# Patient Record
Sex: Male | Born: 1970 | Hispanic: Yes | Marital: Married | Smoking: Never smoker
Health system: Southern US, Community
[De-identification: ages and names within clinical notes are randomized; demographics above are authoritative.]

## PROBLEM LIST (undated history)

## (undated) DIAGNOSIS — I2510 Atherosclerotic heart disease of native coronary artery without angina pectoris: Secondary | ICD-10-CM

## (undated) DIAGNOSIS — E7800 Pure hypercholesterolemia, unspecified: Secondary | ICD-10-CM

## (undated) DIAGNOSIS — M797 Fibromyalgia: Secondary | ICD-10-CM

## (undated) DIAGNOSIS — E119 Type 2 diabetes mellitus without complications: Secondary | ICD-10-CM

## (undated) DIAGNOSIS — I209 Angina pectoris, unspecified: Secondary | ICD-10-CM

## (undated) DIAGNOSIS — M1990 Unspecified osteoarthritis, unspecified site: Secondary | ICD-10-CM

## (undated) DIAGNOSIS — M109 Gout, unspecified: Secondary | ICD-10-CM

## (undated) DIAGNOSIS — R0602 Shortness of breath: Secondary | ICD-10-CM

## (undated) HISTORY — DX: Fibromyalgia: M79.7

---
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
## 1998-11-10 HISTORY — PX: GASTRIC BYPASS: SHX52

## 2012-04-19 ENCOUNTER — Encounter (HOSPITAL_BASED_OUTPATIENT_CLINIC_OR_DEPARTMENT_OTHER): Payer: Self-pay | Admitting: *Deleted

## 2012-04-19 ENCOUNTER — Emergency Department (HOSPITAL_BASED_OUTPATIENT_CLINIC_OR_DEPARTMENT_OTHER)
Admission: EM | Admit: 2012-04-19 | Discharge: 2012-04-19 | Disposition: A | Discharge status: Home / Self Care | Payer: Managed Care, Other (non HMO) | Attending: Emergency Medicine | Admitting: Emergency Medicine

## 2012-04-19 DIAGNOSIS — L02419 Cutaneous abscess of limb, unspecified: Secondary | ICD-10-CM | POA: Insufficient documentation

## 2012-04-19 DIAGNOSIS — L03115 Cellulitis of right lower limb: Secondary | ICD-10-CM

## 2012-04-19 DIAGNOSIS — E119 Type 2 diabetes mellitus without complications: Secondary | ICD-10-CM | POA: Insufficient documentation

## 2012-04-19 DIAGNOSIS — Z794 Long term (current) use of insulin: Secondary | ICD-10-CM | POA: Insufficient documentation

## 2012-04-19 DIAGNOSIS — R739 Hyperglycemia, unspecified: Secondary | ICD-10-CM

## 2012-04-19 LAB — GLUCOSE, CAPILLARY: Glucose-Capillary: 308 mg/dL — ABNORMAL HIGH (ref 70–99)

## 2012-04-19 MED ORDER — SULFAMETHOXAZOLE-TMP DS 800-160 MG PO TABS: 1.0000 | ORAL_TABLET | Freq: Once | ORAL | Status: AC

## 2012-04-19 MED ORDER — HYDROCODONE-ACETAMINOPHEN 5-325 MG PO TABS
2.0000 | ORAL_TABLET | Freq: Once | ORAL | Status: AC
  Administered 2012-04-19: 2 via ORAL
  Filled 2012-04-19: qty 2

## 2012-04-19 MED ORDER — INSULIN REGULAR HUMAN 100 UNIT/ML IJ SOLN
8.0000 [IU] | Freq: Once | INTRAMUSCULAR | Status: AC
  Administered 2012-04-19: 8 [IU] via SUBCUTANEOUS

## 2012-04-19 MED ORDER — INSULIN REGULAR HUMAN 100 UNIT/ML IJ SOLN
INTRAMUSCULAR | Status: AC
  Filled 2012-04-19: qty 1

## 2012-04-19 MED ORDER — HYDROCODONE-ACETAMINOPHEN 5-325 MG PO TABS: ORAL_TABLET | ORAL | Status: AC

## 2012-04-19 MED ORDER — CEPHALEXIN 500 MG PO CAPS: 500.0000 mg | ORAL_CAPSULE | Freq: Two times a day (BID) | ORAL | Status: AC

## 2012-04-19 MED ORDER — SULFAMETHOXAZOLE-TMP DS 800-160 MG PO TABS
1.0000 | ORAL_TABLET | Freq: Once | ORAL | Status: AC
  Administered 2012-04-19: 1 via ORAL
  Filled 2012-04-19: qty 1

## 2012-04-19 MED ORDER — METFORMIN HCL 500 MG PO TABS: 500.0000 mg | ORAL_TABLET | Freq: Two times a day (BID) | ORAL | Status: DC

## 2012-04-19 NOTE — ED Notes (Signed)
Redness, swelling, pain to his right lower leg. Hx of diabetes. His legs got weak and he fell cutting his leg on a wooden stairway 5 days ago.

## 2012-04-19 NOTE — ED Provider Notes (Signed)
History   This chart was scribed for Gavin Pound. Oletta Lamas, MD by Melba Coon. The patient was seen in room MH12/MH12 and the patient's care was started at 9:42PM.    CSN: [id number]  Arrival date & time 04/19/12  1956   First MD Initiated Contact with Patient 04/19/12 2131      Chief Complaint  Patient presents with  . Fall    (Consider location/radiation/quality/duration/timing/severity/associated sxs/prior treatment) HPI Cameron Jensen is a 41 y.o. male who presents to the Emergency Department complaining of constant, throbbing, moderate to severe right shin pain with associated redness an onset 5 days ago pertaining to a fall, no head contact, no LOC. Pt has Hx of diabetes mellitus and sometimes gets leg weakness and numbness; pt states that his legs gave out then he fell. Pt just moved here to [facility] from [place] and has not found a PCP to refill his medications; Pt ran out of medication 3 months ago; pt took metformin, Lantis and Novolog. Pt states that he feels a pull in groin and feet; pt wears diabetic compression socks and when he took them off, the pain started to throb. Pt is able to ambulate, but ambulation aggravates the pain. No HA, fever, neck pain, sore throat, rash, back pain, CP, SOB, abd pain, n/v/d, dysuria, or extremity tingling. No known allergies. No other pertinent medical symptoms.  Past Medical History  Diagnosis Date  . Diabetes mellitus     Past Surgical History  Procedure Date  . Knee surgery   . Gastric bypass   . Arthoscopic rotaor cuff repair     No family history on file.  History  Substance Use Topics  . Smoking status: Never Smoker   . Smokeless tobacco: Not on file  . Alcohol Use: No      Review of Systems  Constitutional: Negative for fever and chills.  Musculoskeletal: Positive for arthralgias.  Skin: Positive for wound.   10 Systems reviewed and all are negative for acute change except as noted in the HPI.   Allergies  Review of  patient's allergies indicates no known allergies.  Home Medications   Current Outpatient Rx  Name Route Sig Dispense Refill  . CEPHALEXIN 500 MG PO CAPS Oral Take 1 capsule (500 mg total) by mouth 2 (two) times daily. 20 capsule 0  . HYDROCODONE-ACETAMINOPHEN 5-325 MG PO TABS  1-2 tablets po q 6 hours prn moderate to severe pain 20 tablet 0  . METFORMIN HCL 500 MG PO TABS Oral Take 1 tablet (500 mg total) by mouth 2 (two) times daily with a meal. 60 tablet 0  . SULFAMETHOXAZOLE-TMP DS 800-160 MG PO TABS Oral Take 1 tablet by mouth once. 20 tablet 0    BP 163/91  Pulse 89  Temp(Src) 98.1 F (36.7 C) (Oral)  Resp 20  SpO2 98%  Physical Exam  Nursing note and vitals reviewed. Constitutional: He is oriented to person, place, and time. He appears well-developed and well-nourished. No distress.       Awake, alert, nontoxic appearance.  HENT:  Head: Normocephalic and atraumatic.  Right Ear: External ear normal.  Left Ear: External ear normal.  Eyes: EOM are normal. Pupils are equal, round, and reactive to light. Right eye exhibits no discharge. Left eye exhibits no discharge.  Neck: Normal range of motion.  Cardiovascular: Normal rate, regular rhythm and normal heart sounds.   No murmur heard. Pulmonary/Chest: Effort normal and breath sounds normal. He exhibits no tenderness.  Abdominal: Soft. Bowel sounds  are normal. There is no tenderness. There is no rebound.  Musculoskeletal: Normal range of motion. He exhibits edema (Right shin) and tenderness (minimal tenderness to right shin).       Baseline ROM, no obvious new focal weakness.  Neurological: He is alert and oriented to person, place, and time.       Mental status and motor strength appears baseline for patient and situation.  Skin: Skin is warm and dry. He is not diaphoretic. There is erythema (erythematous patch to right shin with 2 open sores with dry exudate, no expression of purulent drainage).  Psychiatric: He has a normal  mood and affect. His behavior is normal.    ED Course  Procedures (including critical care time)  DIAGNOSTIC STUDIES: Oxygen Saturation is 98% on room air, normal by my interpretation.    COORDINATION OF CARE:  9:49PM - EDMD will take blood glucose and order metformin; pt will be referred to PCP   Labs Reviewed  GLUCOSE, CAPILLARY - Abnormal; Notable for the following:    Glucose-Capillary 308 (*)    All other components within normal limits   No results found.   1. Cellulitis of right anterior lower leg   2. Hyperglycemia       MDM  I personally performed the services described in this documentation, which was scribed in my presence. The recorded information has been reviewed and considered.    Pt is not toxic in appearance, no fever.  Pt needs PCP as well.  Referred to [place] [facility] at [facility] for both follow up and establish care. Glucose is only in 300's.  I gave refill for metformin to restart treatment, also oral abx, double coverage for focal cellulitis of lower leg.        Gavin Pound. Oletta Lamas, MD 04/24/12 [phone]

## 2012-04-19 NOTE — Discharge Instructions (Signed)
Cellulitis Cellulitis is an infection of the skin and the tissue beneath it. The area is typically red and tender. It is caused by germs (bacteria) (usually staph or strep) that enter the body through cuts or sores. Cellulitis most commonly occurs in the arms or lower legs.  HOME CARE INSTRUCTIONS   If you are given a prescription for medications which kill germs (antibiotics), take as directed until finished.   If the infection is on the arm or leg, keep the limb elevated as able.   Use a warm cloth several times per day to relieve pain and encourage healing.   See your caregiver for recheck of the infected site as directed if problems arise.   Only take over-the-counter or prescription medicines for pain, discomfort, or fever as directed by your caregiver.  SEEK MEDICAL CARE IF:   The area of redness (inflammation) is spreading, there are red streaks coming from the infected site, or if a part of the infection begins to turn dark in color.   The joint or bone underneath the infected skin becomes painful after the skin has healed.   The infection returns in the same or another area after it seems to have gone away.   A boil or bump swells up. This may be an abscess.   New, unexplained problems such as pain or fever develop.  SEEK IMMEDIATE MEDICAL CARE IF:   You have a fever.   You or your child feels drowsy or lethargic.   There is vomiting, diarrhea, or lasting discomfort or feeling ill (malaise) with muscle aches and pains.  MAKE SURE YOU:   Understand these instructions.   Will watch your condition.   Will get help right away if you are not doing well or get worse.  Document Released: 08/06/2005 Document Revised: 10/16/2011 Document Reviewed: 06/14/2008 [facility] Patient Information 2012 [place], [place].Hyperglycemia Hyperglycemia occurs when the glucose (sugar) in your blood is too high. Hyperglycemia can happen for many reasons, but it most often happens to people who do  not know they have diabetes or are not managing their diabetes properly.  CAUSES  Whether you have diabetes or not, there are other causes of hyperglycemia. Hyperglycemia can occur when you have diabetes, but it can also occur in other situations that you might not be as aware of, such as: Diabetes  If you have diabetes and are having problems controlling your blood glucose, hyperglycemia could occur because of some of the following reasons:   Not following your meal plan.   Not taking your diabetes medications or not taking it properly.   Exercising less or doing less activity than you normally do.   Being sick.  Pre-diabetes  This cannot be ignored. Before people develop Type 2 diabetes, they almost always have "pre-diabetes." This is when your blood glucose levels are higher than normal, but not yet high enough to be diagnosed as diabetes. Research has shown that some long-term damage to the body, especially the heart and circulatory system, may already be occurring during pre-diabetes. If you take action to manage your blood glucose when you have pre-diabetes, you may delay or prevent Type 2 diabetes from developing.  Stress  If you have diabetes, you may be "diet" controlled or on oral medications or insulin to control your diabetes. However, you may find that your blood glucose is higher than usual in the hospital whether you have diabetes or not. This is often referred to as "stress hyperglycemia." Stress can elevate your blood glucose.  This happens because of hormones put out by the body during times of stress. If stress has been the cause of your high blood glucose, it can be followed regularly by your caregiver. That way he/she can make sure your hyperglycemia does not continue to get worse or progress to diabetes.  Steroids  Steroids are medications that act on the infection fighting system (immune system) to block inflammation or infection. One side effect can be a rise in blood  glucose. Most people can produce enough extra insulin to allow for this rise, but for those who cannot, steroids make blood glucose levels go even higher. It is not unusual for steroid treatments to "uncover" diabetes that is developing. It is not always possible to determine if the hyperglycemia will go away after the steroids are stopped. A special blood test called an A1c is sometimes done to determine if your blood glucose was elevated before the steroids were started.  SYMPTOMS  Thirsty.   Frequent urination.   Dry mouth.   Blurred vision.   Tired or fatigue.   Weakness.   Sleepy.   Tingling in feet or leg.  DIAGNOSIS  Diagnosis is made by monitoring blood glucose in one or all of the following ways:  A1c test. This is a chemical found in your blood.   Fingerstick blood glucose monitoring.   Laboratory results.  TREATMENT  First, knowing the cause of the hyperglycemia is important before the hyperglycemia can be treated. Treatment may include, but is not be limited to:  Education.   Change or adjustment in medications.   Change or adjustment in meal plan.   Treatment for an illness, infection, etc.   More frequent blood glucose monitoring.   Change in exercise plan.   Decreasing or stopping steroids.   Lifestyle changes.  HOME CARE INSTRUCTIONS   Test your blood glucose as directed.   Exercise regularly. Your caregiver will give you instructions about exercise. Pre-diabetes or diabetes which comes on with stress is helped by exercising.   Eat wholesome, balanced meals. Eat often and at regular, fixed times. Your caregiver or nutritionist will give you a meal plan to guide your sugar intake.   Being at an ideal weight is important. If needed, losing as little as 10 to 15 pounds may help improve blood glucose levels.  SEEK MEDICAL CARE IF:   You have questions about medicine, activity, or diet.   You continue to have symptoms (problems such as increased  thirst, urination, or weight gain).  SEEK IMMEDIATE MEDICAL CARE IF:   You are vomiting or have diarrhea.   Your breath smells fruity.   You are breathing faster or slower.   You are very sleepy or incoherent.   You have numbness, tingling, or pain in your feet or hands.   You have chest pain.   Your symptoms get worse even though you have been following your caregiver's orders.   If you have any other questions or concerns.  Document Released: 04/22/2001 Document Revised: 10/16/2011 Document Reviewed: 06/18/2009 [facility] Patient Information 2012 [place], [place].

## 2012-04-24 ENCOUNTER — Encounter (HOSPITAL_BASED_OUTPATIENT_CLINIC_OR_DEPARTMENT_OTHER): Payer: Self-pay | Admitting: Emergency Medicine

## 2012-05-10 ENCOUNTER — Ambulatory Visit: Payer: Managed Care, Other (non HMO) | Admitting: Family

## 2012-05-10 DIAGNOSIS — Z0289 Encounter for other administrative examinations: Secondary | ICD-10-CM

## 2013-02-22 ENCOUNTER — Ambulatory Visit
Admission: RE | Admit: 2013-02-22 | Discharge: 2013-02-22 | Disposition: A | Discharge status: Home / Self Care | Payer: Managed Care, Other (non HMO) | Source: Ambulatory Visit | Attending: Sports Medicine | Admitting: Sports Medicine

## 2013-02-22 ENCOUNTER — Other Ambulatory Visit: Payer: Self-pay | Admitting: Sports Medicine

## 2013-02-22 DIAGNOSIS — R52 Pain, unspecified: Secondary | ICD-10-CM

## 2013-04-10 HISTORY — PX: CARDIAC CATHETERIZATION: SHX172

## 2013-04-25 ENCOUNTER — Observation Stay (HOSPITAL_COMMUNITY)
Admission: EM | Admit: 2013-04-25 | Discharge: 2013-04-26 | Disposition: A | Discharge status: Home / Self Care | Payer: Managed Care, Other (non HMO) | Attending: Cardiology | Admitting: Cardiology

## 2013-04-25 ENCOUNTER — Other Ambulatory Visit: Payer: Self-pay

## 2013-04-25 ENCOUNTER — Encounter (HOSPITAL_COMMUNITY): Admission: EM | Disposition: A | Discharge status: Home / Self Care | Payer: Self-pay | Source: Home / Self Care

## 2013-04-25 ENCOUNTER — Other Ambulatory Visit: Payer: Self-pay | Admitting: Cardiology

## 2013-04-25 ENCOUNTER — Ambulatory Visit (HOSPITAL_COMMUNITY): Admit: 2013-04-25 | Payer: Self-pay | Admitting: Interventional Cardiology

## 2013-04-25 DIAGNOSIS — E1149 Type 2 diabetes mellitus with other diabetic neurological complication: Secondary | ICD-10-CM | POA: Diagnosis present

## 2013-04-25 DIAGNOSIS — IMO0002 Reserved for concepts with insufficient information to code with codable children: Secondary | ICD-10-CM | POA: Diagnosis present

## 2013-04-25 DIAGNOSIS — I2510 Atherosclerotic heart disease of native coronary artery without angina pectoris: Principal | ICD-10-CM | POA: Diagnosis not present

## 2013-04-25 DIAGNOSIS — Z6841 Body Mass Index (BMI) 40.0 and over, adult: Secondary | ICD-10-CM

## 2013-04-25 DIAGNOSIS — I200 Unstable angina: Secondary | ICD-10-CM | POA: Diagnosis present

## 2013-04-25 DIAGNOSIS — E785 Hyperlipidemia, unspecified: Secondary | ICD-10-CM | POA: Insufficient documentation

## 2013-04-25 DIAGNOSIS — E1165 Type 2 diabetes mellitus with hyperglycemia: Secondary | ICD-10-CM

## 2013-04-25 DIAGNOSIS — Z79899 Other long term (current) drug therapy: Secondary | ICD-10-CM | POA: Insufficient documentation

## 2013-04-25 DIAGNOSIS — R0789 Other chest pain: Secondary | ICD-10-CM | POA: Insufficient documentation

## 2013-04-25 DIAGNOSIS — E119 Type 2 diabetes mellitus without complications: Secondary | ICD-10-CM | POA: Insufficient documentation

## 2013-04-25 DIAGNOSIS — R9439 Abnormal result of other cardiovascular function study: Secondary | ICD-10-CM | POA: Insufficient documentation

## 2013-04-25 HISTORY — PX: LEFT HEART CATHETERIZATION WITH CORONARY ANGIOGRAM: SHX5451

## 2013-04-25 LAB — CBC
HCT: 40.1 % (ref 39.0–52.0)
HCT: 37 % — ABNORMAL LOW (ref 39.0–52.0)
Hemoglobin: 12.6 g/dL — ABNORMAL LOW (ref 13.0–17.0)
MCHC: 34.7 g/dL (ref 30.0–36.0)
MCV: 88.3 fL (ref 78.0–100.0)
RDW: 13.7 % (ref 11.5–15.5)
RDW: 13.8 % (ref 11.5–15.5)
WBC: 11.3 10*3/uL — ABNORMAL HIGH (ref 4.0–10.5)

## 2013-04-25 LAB — CREATININE, SERUM: GFR calc Af Amer: >90 mL/min (ref >90)

## 2013-04-25 LAB — BASIC METABOLIC PANEL
BUN: 11 mg/dL (ref 6–23)
Chloride: 101 mEq/L (ref 96–112)
Creatinine, Ser: 0.65 mg/dL (ref 0.50–1.35)
GFR calc Af Amer: >90 mL/min (ref >90)
GFR calc non Af Amer: >90 mL/min (ref >90)

## 2013-04-25 LAB — PLATELET INHIBITION P2Y12: Platelet Function  P2Y12: 294 [PRU] (ref 194–418)

## 2013-04-25 LAB — GLUCOSE, CAPILLARY: Glucose-Capillary: 189 mg/dL — ABNORMAL HIGH (ref 70–99)

## 2013-04-25 LAB — PROTIME-INR: INR: 0.89 (ref 0.00–1.49)

## 2013-04-25 SURGERY — LEFT HEART CATHETERIZATION WITH CORONARY ANGIOGRAM
Anesthesia: LOCAL

## 2013-04-25 MED ORDER — OXYCODONE-ACETAMINOPHEN 5-325 MG PO TABS: 1.0000 | ORAL_TABLET | ORAL | Status: DC | PRN

## 2013-04-25 MED ORDER — METOPROLOL SUCCINATE ER 25 MG PO TB24
25.0000 mg | ORAL_TABLET | Freq: Every day | ORAL | Status: DC
  Administered 2013-04-25 – 2013-04-26 (×2): 25 mg via ORAL
  Filled 2013-04-25 (×2): qty 1

## 2013-04-25 MED ORDER — HEPARIN SODIUM (PORCINE) 1000 UNIT/ML IJ SOLN
INTRAMUSCULAR | Status: AC
  Filled 2013-04-25: qty 1

## 2013-04-25 MED ORDER — MIDAZOLAM HCL 2 MG/2ML IJ SOLN
INTRAMUSCULAR | Status: AC
  Filled 2013-04-25: qty 2

## 2013-04-25 MED ORDER — LIDOCAINE HCL (PF) 1 % IJ SOLN
INTRAMUSCULAR | Status: AC
  Filled 2013-04-25: qty 30

## 2013-04-25 MED ORDER — ACETAMINOPHEN 325 MG PO TABS: 650.0000 mg | ORAL_TABLET | ORAL | Status: DC | PRN

## 2013-04-25 MED ORDER — ONDANSETRON HCL 4 MG/2ML IJ SOLN: 4.0000 mg | Freq: Four times a day (QID) | INTRAMUSCULAR | Status: DC | PRN

## 2013-04-25 MED ORDER — SODIUM CHLORIDE 0.9 % IV SOLN
INTRAVENOUS | Status: DC
  Administered 2013-04-25: 600 mL via INTRAVENOUS

## 2013-04-25 MED ORDER — ENOXAPARIN SODIUM 40 MG/0.4ML ~~LOC~~ SOLN
40.0000 mg | SUBCUTANEOUS | Status: DC
  Administered 2013-04-26: 09:00:00 40 mg via SUBCUTANEOUS
  Filled 2013-04-25 (×2): qty 0.4

## 2013-04-25 MED ORDER — ASPIRIN 81 MG PO CHEW: 324.0000 mg | CHEWABLE_TABLET | Freq: Once | ORAL | Status: DC

## 2013-04-25 MED ORDER — FENTANYL CITRATE 0.05 MG/ML IJ SOLN
INTRAMUSCULAR | Status: AC
  Filled 2013-04-25: qty 2

## 2013-04-25 MED ORDER — ATORVASTATIN CALCIUM 40 MG PO TABS
40.0000 mg | ORAL_TABLET | Freq: Every day | ORAL | Status: DC
  Administered 2013-04-25: 18:00:00 40 mg via ORAL
  Filled 2013-04-25 (×2): qty 1

## 2013-04-25 MED ORDER — ASPIRIN EC 325 MG PO TBEC
325.0000 mg | DELAYED_RELEASE_TABLET | Freq: Every day | ORAL | Status: DC
  Administered 2013-04-26: 09:00:00 325 mg via ORAL
  Filled 2013-04-25: qty 1

## 2013-04-25 MED ORDER — SODIUM CHLORIDE 0.9 % IV SOLN: INTRAVENOUS | Status: DC

## 2013-04-25 MED ORDER — CLOPIDOGREL BISULFATE 75 MG PO TABS
75.0000 mg | ORAL_TABLET | Freq: Every day | ORAL | Status: DC
  Administered 2013-04-26: 75 mg via ORAL
  Filled 2013-04-25: qty 1

## 2013-04-25 MED ORDER — VERAPAMIL HCL 2.5 MG/ML IV SOLN
INTRAVENOUS | Status: AC
  Filled 2013-04-25: qty 2

## 2013-04-25 MED ORDER — NITROGLYCERIN 0.4 MG SL SUBL: 0.4000 mg | SUBLINGUAL_TABLET | SUBLINGUAL | Status: DC | PRN

## 2013-04-25 MED ORDER — ASPIRIN 81 MG PO CHEW
CHEWABLE_TABLET | ORAL | Status: AC
  Administered 2013-04-25: 324 mg
  Filled 2013-04-25: qty 4

## 2013-04-25 MED ORDER — DIAZEPAM 2 MG PO TABS: 2.0000 mg | ORAL_TABLET | ORAL | Status: DC

## 2013-04-25 MED ORDER — HEPARIN SODIUM (PORCINE) 5000 UNIT/ML IJ SOLN
INTRAMUSCULAR | Status: AC
  Administered 2013-04-25: 4000 [IU]
  Filled 2013-04-25: qty 1

## 2013-04-25 MED ORDER — HEPARIN (PORCINE) IN NACL 2-0.9 UNIT/ML-% IJ SOLN
INTRAMUSCULAR | Status: AC
  Filled 2013-04-25: qty 1000

## 2013-04-25 NOTE — ED Notes (Signed)
Pt BIB EMS for EKG changes during stress test with ST elevation. Dr.Smith met pt in ED.

## 2013-04-25 NOTE — CV Procedure (Signed)
     Diagnostic Cardiac Catheterization Report  Cameron Jensen  42 y.o.  male 1970/12/09  Procedure Date: 04/25/2013 Referring Physician: Armanda Magic, MD Primary Cardiologist:: Armanda Magic, MD   PROCEDURE:  Left heart catheterization with selective coronary angiography, left ventriculogram.  INDICATIONS:  Unstable angina pectoris  The risks, benefits, and details of the procedure were explained to the patient.  The patient verbalized understanding and wanted to proceed.  Informed written consent was obtained.  PROCEDURE TECHNIQUE:  After Xylocaine anesthesia a 5 French Slender sheath was placed in the right femoral artery with a single anterior needle wall stick.   Coronary angiography was done using a 5 [place] A2 MP, 5 French 3.5 cm left Judkins, and 5 [place] 3 cm EBU catheter.  Left ventriculography was done using the 5 French A2 MP catheter.   The mid to distal LAD, a very small vessel in the 2 mm diameter range contained a 90-95% focal stenosis within a region of diffuse luminal irregularity. The distal territory is small. Cranial view suggests an eccentric proximal left main. I chose against PCI on the distal LAD because of the small caliber of the vessel and high likelihood stenting would not be possible. Restenosis with balloon angioplasty would be very high.  CONTRAST:  Total of 110 cc.  COMPLICATIONS:  None.    HEMODYNAMICS:  Aortic pressure was 118/78 mmHg; LV pressure was 118/4 mmHg; LVEDP 12 mmHg.  There was no gradient between the left ventricle and aorta.    ANGIOGRAPHIC DATA:   The left main coronary artery is contains eccentric 50% narrowing in AP cranial and LAO cranial views. Other views revealed the left main to be widely patent. BrISK contrast refluxes into the aorta. No damping is noted.  The left anterior descending artery is large given origin to a mid vessel trifurcation resulting in a large diagonal a smaller more medial diagonal and continuation of the  LAD and in the very distal third of the LAD there is focal 90% obstruction in a segment that appears to be 2 mm in diameter. The distal territory beyond the stenosis is small..  The left circumflex artery is normal.  The right coronary artery is dominant with luminal irregularities but no significant obstruction.  LEFT VENTRICULOGRAM:  Left ventricular angiogram was done in the 30 RAO projection and revealed normal left ventricular wall motion and systolic function with an estimated ejection fraction of 60%.    IMPRESSIONS:  1. Early positive exercise treadmill test likely related to distal LAD obstruction as noted above  2. Cranial views of the left main suggests eccentric narrowing, other views reveal wide patency.  3. Essentially normal circumflex and right coronaries  4. Normal LV function   RECOMMENDATION:  Initiate medical therapy and if refractory symptoms consider balloon angioplasty of the distal LAD. This was not chosen as the primary treatment option due to distal location, small vessel caliber, and diabetes which increases the risk of restenosis.

## 2013-04-25 NOTE — ED Notes (Signed)
Pt son placed in CCU waiting room. Cath Lab and Dr.Smith aware

## 2013-04-25 NOTE — Progress Notes (Signed)
CSW was contacted concerning [name] that was with him at time of admission.   Cath Nurse would like CSW to come an assist with finding care for the [name] while Pt is receiving a cath.   [name] waited in lobby of 2nd floor outside cath lab without any disruptions.   CSW contacted unit [id number] to inquire if [name] would be permitted to spend the night. Unit nurse stated [name] could spend the night and would get son a meal voucher for tonight and in the morning.   CSW spoke with the Pt to make Pt aware of arrangements made for tonight, however stressed to the Pt that additional arrangements would need to be made if he were going to need a subsequent night stay. Pt voiced understanding and will attempt to contact his wife for other arrangements. Pt's wife is currently out of town and will be arriving back tomorrow.   CSW informed oncoming CSW about case and CSW will assist if needed. [phone])   Cameron Jensen, LCSWA [facility] Emergency Dept.  [phone]

## 2013-04-25 NOTE — H&P (Signed)
The patient is 22 and for greater than a year has had intermittent episodes of fullness in his left precordial region. The discomfort is exertional at times but can also occur at rest. Approximately 7-10 days ago he had a severe episode of discomfort that was associated with diaphoresis HAL and near fainting. He saw Dr. Mayford Knife 16/5/14 and was scheduled for stress test today which was significant for the development of ST elevation in 2,3, aVF, and lateral precordial leads (less than 1 mm) post exercise. He also had chest discomfort post exercise greater than 3/10. The discomfort is now down to a 1/10 discomfort. He denies dyspnea. He has no history of heart disease, tobacco use, or hypertension. He is a diabetic for greater than 10 years.  The high likelihood of critical coronary disease was discussed with the patient. Coronary angiography and possible PCI were explained in detail. The purpose of stenting was discussed. The nature of catheterization and PCI was discussed including associated risks. Risks of stroke, death, myocardial infarction, allergy, bleeding, kidney injury, limb ischemia, emergency surgery, allergy, among others were discussed in detail and accepted by the patient. The patient consented to proceeding with catheterization and possible PCI.

## 2013-04-25 NOTE — Progress Notes (Signed)
Utilization Review Completed Tashala Cumbo J. Rowland Ericsson, RN, BSN, NCM [phone]  

## 2013-04-25 NOTE — Progress Notes (Signed)
Responded to page from ED Charge nurse to assist staff with Cath Lab pt. and his 12 yr son. Pt. going for cath and son was going to be placed in consult room. Chaplain services was requested to assist with son until family or someone else could be notified. Father spoke with Cath doctor and staff regarding a nieghbor who could be contacted to assist with child's care.  I was informed that  there were no other family who was available. ED nurse and cath lab coordinator insured that child was feed and safe.  CSW was called . Cath staff will call Chaplain if further  services are needed.   Passed on information to all other Chaplain for support as needed.

## 2013-04-26 ENCOUNTER — Encounter (HOSPITAL_COMMUNITY): Payer: Self-pay | Admitting: *Deleted

## 2013-04-26 DIAGNOSIS — I2510 Atherosclerotic heart disease of native coronary artery without angina pectoris: Secondary | ICD-10-CM | POA: Diagnosis not present

## 2013-04-26 LAB — GLUCOSE, CAPILLARY
Glucose-Capillary: 93 mg/dL (ref 70–99)
Glucose-Capillary: 166 mg/dL — ABNORMAL HIGH (ref 70–99)

## 2013-04-26 MED ORDER — NITROGLYCERIN 0.4 MG SL SUBL: 0.4000 mg | SUBLINGUAL_TABLET | SUBLINGUAL | Status: DC | PRN

## 2013-04-26 MED ORDER — ASPIRIN EC 81 MG PO TBEC: 81.0000 mg | DELAYED_RELEASE_TABLET | Freq: Every day | ORAL | Status: DC

## 2013-04-26 MED ORDER — CLOPIDOGREL BISULFATE 75 MG PO TABS: 75.0000 mg | ORAL_TABLET | Freq: Every day | ORAL | Status: DC

## 2013-04-26 MED ORDER — PNEUMOCOCCAL VAC POLYVALENT 25 MCG/0.5ML IJ INJ
0.5000 mL | INJECTION | Freq: Once | INTRAMUSCULAR | Status: AC
  Administered 2013-04-26: 13:00:00 0.5 mL via INTRAMUSCULAR
  Filled 2013-04-26: qty 0.5

## 2013-04-26 MED ORDER — METOPROLOL SUCCINATE ER 25 MG PO TB24: 25.0000 mg | ORAL_TABLET | Freq: Every day | ORAL | Status: DC

## 2013-04-26 MED ORDER — METFORMIN HCL ER (MOD) 500 MG PO TB24: 1000.0000 mg | ORAL_TABLET | Freq: Every day | ORAL | Status: DC

## 2013-04-26 MED ORDER — PNEUMOCOCCAL VAC POLYVALENT 25 MCG/0.5ML IJ INJ: 0.5000 mL | INJECTION | INTRAMUSCULAR | Status: DC

## 2013-04-26 NOTE — Progress Notes (Signed)
CARDIAC REHAB PHASE I   PRE:  Rate/Rhythm: 80 SR    BP: sitting 130/90    SaO2:   MODE:  Ambulation: 1000 ft   POST:  Rate/Rhythm: 94 SR    BP: sitting 135/91     SaO2:   Received consult order to ambulate and educate. Tolerated long walk, including slight incline, well without CP however pt does st he had chest pressure last night. Relieved with time, did not take any medicine. BP elevated before and after walk. Pt does st he feels slightly lightheaded walking. Ed completed including ex gl to slowly increase his walking from 7 min at a time to >30 min a day. Pt to clear this with cardiologist. Also pt is interested in [facility] and requests his name be sent to [facility]. [phone]  Elissa Lovett [place] CES, ACSM 04/26/2013 10:50 AM

## 2013-04-26 NOTE — Progress Notes (Signed)
Pt got dressed and says when he tried to put his watch on his right wrist suddenly swelled.  Pt called RN immediately.  Rt wrist grossly swollen with hematoma approx 3x2x3 cm.  Hematoma compressed easily with manual pressure x 20 min.  Drsg applied.  Pt tolerated well.  Dr Mayford Knife notified, advised to wait 2 hrs for discharge to observe.

## 2013-04-26 NOTE — Progress Notes (Signed)
Received order however order sts "If PCI" which pt did not receive a PCI. Please order Cardiac Rehab Phase I if our services are warranted. Thank you. Ethelda Chick CES, ACSM 7:38 AM 04/26/2013

## 2013-04-26 NOTE — Discharge Summary (Signed)
Patient ID: Cameron Jensen MRN: [id number] DOB/AGE: 02-14-1971 42 y.o.  Admit date: 04/25/2013 Discharge date: 04/26/2013  Primary Discharge Diagnosis  Coronary artery disease Secondary Discharge Diagnosis  DM  Morbid obesity  Significant Diagnostic Studies: angiography:   Diagnostic Cardiac Catheterization Report  Cameron Jensen  42 y.o.  male  [birth year]  Procedure Date: 04/25/2013  Referring Physician: Armanda Magic, MD  Primary Cardiologist:: Armanda Magic, MD  PROCEDURE: Left heart catheterization with selective coronary angiography, left ventriculogram.  INDICATIONS: Unstable angina pectoris  The risks, benefits, and details of the procedure were explained to the patient. The patient verbalized understanding and wanted to proceed. Informed written consent was obtained.  PROCEDURE TECHNIQUE: After Xylocaine anesthesia a 5 French Slender sheath was placed in the right femoral artery with a single anterior needle wall stick. Coronary angiography was done using a 5 [place] A2 MP, 5 French 3.5 cm left Judkins, and 5 [place] 3 cm EBU catheter. Left ventriculography was done using the 5 French A2 MP catheter.  The mid to distal LAD, a very small vessel in the 2 mm diameter range contained a 90-95% focal stenosis within a region of diffuse luminal irregularity. The distal territory is small. Cranial view suggests an eccentric proximal left main. I chose against PCI on the distal LAD because of the small caliber of the vessel and high likelihood stenting would not be possible. Restenosis with balloon angioplasty would be very high.  CONTRAST: Total of 110 cc.  COMPLICATIONS: None.  HEMODYNAMICS: Aortic pressure was 118/78 mmHg; LV pressure was 118/4 mmHg; LVEDP 12 mmHg. There was no gradient between the left ventricle and aorta.  ANGIOGRAPHIC DATA: The left main coronary artery is contains eccentric 50% narrowing in AP cranial and LAO cranial views. Other views revealed the left main to be  widely patent. BrISK contrast refluxes into the aorta. No damping is noted.  The left anterior descending artery is large given origin to a mid vessel trifurcation resulting in a large diagonal a smaller more medial diagonal and continuation of the LAD and in the very distal third of the LAD there is focal 90% obstruction in a segment that appears to be 2 mm in diameter. The distal territory beyond the stenosis is small..  The left circumflex artery is normal.  The right coronary artery is dominant with luminal irregularities but no significant obstruction.  LEFT VENTRICULOGRAM: Left ventricular angiogram was done in the 30 RAO projection and revealed normal left ventricular wall motion and systolic function with an estimated ejection fraction of 60%.  IMPRESSIONS: 1. Early positive exercise treadmill test likely related to distal LAD obstruction as noted above  2. Cranial views of the left main suggests eccentric narrowing, other views reveal wide patency.  3. Essentially normal circumflex and right coronaries  4. Normal LV function  RECOMMENDATION: Initiate medical therapy and if refractory symptoms consider balloon angioplasty of the distal LAD. This was not chosen as the primary treatment option due to distal location, small vessel caliber, and diabetes which increases the risk of restenosis.        Consults: None  Hospital Course: This is a 42yo Hispanic male with a history of DM, obesity, and dyslipidemia who presented to my office for workup after having an episode of profound diaphoresis, mental confusion and chest pain.  He underwent ETT yesterday at which time he became diaphoretic and had a hypotensive BP response to exercise.  EKG was normal in exercise but in recovery he developed CP with 1mm of ST  elevation in the inferior leads and was transferred by EMS to [facility] and underwent cardiac cath which showed distal LAD stenosis of 90% which was small in caliber and Dr. Katrinka Blazing felt medical  management should be tried first and if patient had recurrent symptoms then consider PCI of distal LAD.  He did well post cath and had no further CP.  He was discharged to home in stable condition.   Discharge Exam: Blood pressure 112/64, pulse 84, temperature 97.7 F (36.5 C), temperature source Oral, resp. rate 20, height 5\' 8"  (1.727 m), weight 124.3 kg (274 lb 0.5 oz), SpO2 95.00%.   General appearance: alert Resp: clear to auscultation bilaterally Cardio: regular rate and rhythm, S1, S2 normal, no murmur, click, rub or gallop GI: soft, non-tender; bowel sounds normal; no masses,  no organomegaly Extremities: extremities normal, atraumatic, no cyanosis or edema Labs:   Lab Results  Component Value Date   WBC 11.3* 04/25/2013   HGB 12.6* 04/25/2013   HCT 37.0* 04/25/2013   MCV 88.3 04/25/2013   PLT 236 04/25/2013    Recent Labs Lab 04/25/13 1259 04/25/13 1759  NA 140  --   K 3.9  --   CL 101  --   CO2 27  --   BUN 11  --   CREATININE 0.65 0.79  CALCIUM 9.1  --   GLUCOSE 140*  --       EKG:NSR with no ST changes  FOLLOW UP PLANS AND APPOINTMENTS Discharge Orders   Future Appointments Provider Department Dept Phone   05/06/2013 11:00 AM Suanne Marker, MD [facility] [phone]   Future Orders Complete By Expires     Diet - low sodium heart healthy  As directed     Increase activity slowly  As directed     Lifting restrictions  As directed     Comments:      No lifting more than 10 pounds for 1 week        Medication List    TAKE these medications       atorvastatin 40 MG tablet  Commonly known as:  LIPITOR  Take 40 mg by mouth daily.     clopidogrel 75 MG tablet  Commonly known as:  PLAVIX  Take 1 tablet (75 mg total) by mouth daily with breakfast.     hydrochlorothiazide 25 MG tablet  Commonly known as:  HYDRODIURIL  Take 50 mg by mouth daily.     insulin aspart 100 UNIT/ML injection  Commonly known as:  novoLOG  Inject 10  Units into the skin every morning.     insulin glargine 100 UNIT/ML injection  Commonly known as:  LANTUS  Inject 18 Units into the skin daily.     metFORMIN 500 MG (MOD) 24 hr tablet  Commonly known as:  GLUMETZA  Take 2 tablets (1,000 mg total) by mouth daily with breakfast. Do not restart Metformin until Thursday 6/19 am     metoprolol succinate 25 MG 24 hr tablet  Commonly known as:  TOPROL-XL  Take 1 tablet (25 mg total) by mouth daily.     naproxen sodium 220 MG tablet  Commonly known as:  ANAPROX  Take 220 mg by mouth 2 (two) times daily with a meal.     nitroGLYCERIN 0.4 MG SL tablet  Commonly known as:  NITROSTAT  Place 1 tablet (0.4 mg total) under the tongue every 5 (five) minutes as needed for chest pain.     VICTOZA 18  MG/3ML Sopn  Generic drug:  Liraglutide  Inject 1.2 mg into the skin every morning.           Follow-up Information   Follow up with Quintella Reichert, MD On 05/09/2013. (at 1:45pm)    Contact information:   [address] [place] [address] [phone]       BRING ALL MEDICATIONS WITH YOU TO FOLLOW UP APPOINTMENTS  Time spent with patient to include physician time:35 minutes Signed: TURNER,TRACI R 04/26/2013, 11:10 AM

## 2013-04-26 NOTE — Progress Notes (Signed)
Wrist has remained level 0.  Discharge instructions reviewed. Advised pt to hold pressure if reoccurance at home and call [phone] if cannot stop bleeding. Pt denies complaints.  Questions answered.

## 2013-04-28 ENCOUNTER — Telehealth: Payer: Self-pay | Admitting: Diagnostic Neuroimaging

## 2013-04-28 NOTE — Telephone Encounter (Signed)
Calling to reschedule patient's appt on 05/06/2013 with Dr. Marjory Lies.  Need to reschedule for July 11, 14, 15 or 16th in the AM

## 2013-05-06 ENCOUNTER — Ambulatory Visit: Payer: Managed Care, Other (non HMO) | Admitting: Diagnostic Neuroimaging

## 2013-05-20 ENCOUNTER — Encounter: Payer: Self-pay | Admitting: Diagnostic Neuroimaging

## 2013-05-20 ENCOUNTER — Ambulatory Visit (INDEPENDENT_AMBULATORY_CARE_PROVIDER_SITE_OTHER): Payer: Managed Care, Other (non HMO) | Admitting: Diagnostic Neuroimaging

## 2013-05-20 VITALS — BP 131/88 | HR 110 | Ht 69.0 in | Wt 270.0 lb

## 2013-05-20 DIAGNOSIS — F05 Delirium due to known physiological condition: Secondary | ICD-10-CM

## 2013-05-20 NOTE — Progress Notes (Signed)
[facility]  PATIENT: Cameron Jensen DOB: [birth year]   REFERRING PHYSICIAN: Armanda Magic, MD HISTORY FROM: patient, chart REASON FOR VISIT: consult   HISTORICAL  CHIEF COMPLAINT:  Chief Complaint  Patient presents with  . Tremors    HISTORY OF PRESENT ILLNESS: Mr. Cameron Jensen is 42 year-old right-handed Hispanic male here for consultation of an episode of profuse sweating, arm tremor, facial droop (?) and disorientation in early June 2014.  Patient states he was driving to work and the next thing he remembered he was sweating profusely, he felt somewhat disoriented, and his right arm started to shake. He states that he also had some chest pressure.  [facility] and went into the [facility] to be seen.  He remembers the nurse stating that he was very pale and a few minutes later EMS arrived and to evaluate him.  They advised him to the ER for evaluation but he refused.  He states that after about 45 minutes he began to feel better and went home and slept for the next 36 hours.  A couple days later he went to see his primary care provider Lovenia Kim who referred him to Armanda Magic for cardiac evaluation.  Cardiac enzymes and 2-D echo were normal.  He was scheduled for a stress test. After the stress test he developed chest discomfort with ST elevation.  He was then sent to the hospital for cardiac catheterization which showed 90% blockage in a portion the distal LAD.  Because the blockage was distal and the artery very small he was advised to managed medically.  His medication management fails he will be referred for angioplasty.  Chronic health problems include diabetes type 2 on insulin, hyperlipidemia, hypertension, and morbid obesity.  REVIEW OF SYSTEMS: Full 14 system review of systems performed and notable only for: Constitutional: Fatigue  Cardiovascular: Chest pain, swelling and legs Ear/Nose/Throat: N/A  Skin: N/A  Eyes: Blurred vision    Respiratory: Short of breath Gastroitestinal: N/A  Genitourinary: Impotence Hematology/Lymphatic: N/A  Endocrine: Feeling hot, cold.  Musculoskeletal: Joint pain, cramps, aching muscles  Allergy/Immunology: N/A  Neurological: Confusion, dizziness, tremor Psychiatric: Not enough sleep.   ALLERGIES: No Known Allergies  HOME MEDICATIONS: Outpatient Prescriptions Prior to Visit  Medication Sig Dispense Refill  . aspirin EC 81 MG tablet Take 1 tablet (81 mg total) by mouth daily.      Marland Kitchen atorvastatin (LIPITOR) 40 MG tablet Take 40 mg by mouth daily.      . clopidogrel (PLAVIX) 75 MG tablet Take 1 tablet (75 mg total) by mouth daily with breakfast.  30 tablet  11  . hydrochlorothiazide (HYDRODIURIL) 25 MG tablet Take 50 mg by mouth daily.      . insulin aspart (NOVOLOG) 100 UNIT/ML injection Inject 10 Units into the skin every morning.      . insulin glargine (LANTUS) 100 UNIT/ML injection Inject 18 Units into the skin daily.      . Liraglutide (VICTOZA) 18 MG/3ML SOPN Inject 1.2 mg into the skin every morning.       . metFORMIN (GLUMETZA) 500 MG (MOD) 24 hr tablet Take 2 tablets (1,000 mg total) by mouth daily with breakfast. Do not restart Metformin until Thursday 6/19 am      . metoprolol succinate (TOPROL-XL) 25 MG 24 hr tablet Take 1 tablet (25 mg total) by mouth daily.  30 tablet  11  . naproxen sodium (ANAPROX) 220 MG tablet Take 220 mg by mouth 2 (two) times  daily with a meal.      . nitroGLYCERIN (NITROSTAT) 0.4 MG SL tablet Place 1 tablet (0.4 mg total) under the tongue every 5 (five) minutes as needed for chest pain.  25 tablet  12   No facility-administered medications prior to visit.    PAST MEDICAL HISTORY: Past Medical History  Diagnosis Date  . Diabetes mellitus   . Fibromyalgia     PAST SURGICAL HISTORY: Past Surgical History  Procedure Laterality Date  . Knee surgery    . Gastric bypass    . Arthoscopic rotaor cuff repair      FAMILY HISTORY: Family  History  Problem Relation Age of Onset  . Diabetes    . Hypertension      SOCIAL HISTORY: History   Social History  . Marital Status: Married    Spouse Name: Maxine Glenn    Number of Children: 2  . Years of Education: 12th   Occupational History  . [employer]    Social History Main Topics  . Smoking status: Never Smoker   . Smokeless tobacco: Never Used  . Alcohol Use: Yes     Comment: occasionally  . Drug Use: No  . Sexually Active: Not on file   Other Topics Concern  . Not on file   Social History Narrative   Pt lives at home family.   Caffeine Use: Occasionally     PHYSICAL EXAM  Filed Vitals:   05/20/13 1028 05/20/13 1036  BP: 121/85 131/88  Pulse: 92 110  Height: 5\' 9"  (1.753 m)   Weight: 270 lb (122.471 kg)    Body mass index is 39.85 kg/(m^2).  Generalized: In no acute distress, pleasant Hispanic male.  Morbid obesity.   Neck: Supple, no carotid bruits   Cardiac: Regular rate rhythm, no murmur   Pulmonary: Clear to auscultation bilaterally   Musculoskeletal: No deformity   Neurological examination   Mentation: Alert oriented to time, place, history taking, language fluent, and causual conversation  Cranial nerve II-XII: Pupils were equal round reactive to light extraocular movements were full, visual field were full on confrontational test. facial sensation and strength were normal. hearing was intact to finger rubbing bilaterally. Uvula tongue midline. head turning and shoulder shrug and were normal and symmetric.Tongue protrusion into cheek strength was normal. MOTOR: normal bulk and tone, full strength in the BUE, BLE, fine finger movements normal, no pronator drift SENSORY: normal and symmetric to light touch, pinprick, temperature, vibration and proprioception COORDINATION: finger-nose-finger, heel-to-shin bilaterally, there was no truncal ataxia REFLEXES: Brachioradialis 2/2, biceps 2/2, triceps 2/2, patellar 2/2, Achilles 2/2, plantar responses were  flexor bilaterally. GAIT/STATION: Rising up from seated position without assistance, normal stance, without trunk ataxia, moderate stride, good arm swing, smooth turning, able to perform tiptoe, and heel walking without difficulty.   DIAGNOSTIC DATA (LABS, IMAGING, TESTING) - I reviewed patient records, labs, notes, testing and imaging myself where available.  Lab Results  Component Value Date   WBC 11.3* 04/25/2013   HGB 12.6* 04/25/2013   HCT 37.0* 04/25/2013   MCV 88.3 04/25/2013   PLT 236 04/25/2013      Component Value Date/Time   NA 140 04/25/2013 1259   K 3.9 04/25/2013 1259   CL 101 04/25/2013 1259   CO2 27 04/25/2013 1259   GLUCOSE 140* 04/25/2013 1259   BUN 11 04/25/2013 1259   CREATININE 0.79 04/25/2013 1759   CALCIUM 9.1 04/25/2013 1259   GFRNONAA >90 04/25/2013 1759   GFRAA >90 04/25/2013 1759  04/25/2013   Left heart catheterization with selective coronary angiography, left ventriculogram.  The mid to distal LAD, a very small vessel in the 2 mm diameter range contained a 90-95% focal stenosis within a region of diffuse luminal irregularity. The distal territory is small. Cranial view suggests an eccentric proximal left main.  ASSESSMENT AND PLAN  41 y.o. year old male  has a past medical history of Diabetes mellitus obesity, and dyslipidemia here with acute confusional state with reported facial droop and tremor.  Cardiac workup has been completed and positive for distal LAD stenosis of 90% which was small in caliber.  This likely represents the cause of this event, but we will also evaluate for seizure and complete TIA workup.  PLAN: 1. Carotid artery dopplers 2. EEG 3. MRI/MRA head Wo Contrast   Orders Placed This Encounter  Procedures  . MR Brain Wo Contrast  . US Carotid Duplex Bilateral  . MR MRA HEAD WO CONTRAST  . EEG adult    Suanne Marker, MD and LYNN LAM NP-C 05/20/2013, 11:47 AM Certified in Neurology, Neurophysiology and Neuroimaging  [facility] [address] [place], [place] [address] [phone]

## 2013-05-20 NOTE — Patient Instructions (Signed)
We will order EEG to evaluate for seizure.  MRI and MRA of brain to evaluate for any brain abnormality.  Carotid Artery Doppler Study to evaluate for carotid artery stenosis.  We will call you results after all testing is complete.

## 2013-05-25 ENCOUNTER — Ambulatory Visit (INDEPENDENT_AMBULATORY_CARE_PROVIDER_SITE_OTHER): Payer: Managed Care, Other (non HMO) | Admitting: Radiology

## 2013-05-25 DIAGNOSIS — F05 Delirium due to known physiological condition: Secondary | ICD-10-CM

## 2013-05-26 ENCOUNTER — Ambulatory Visit (INDEPENDENT_AMBULATORY_CARE_PROVIDER_SITE_OTHER): Payer: Managed Care, Other (non HMO)

## 2013-05-26 DIAGNOSIS — F05 Delirium due to known physiological condition: Secondary | ICD-10-CM

## 2013-05-31 ENCOUNTER — Ambulatory Visit (INDEPENDENT_AMBULATORY_CARE_PROVIDER_SITE_OTHER): Payer: Managed Care, Other (non HMO)

## 2013-05-31 DIAGNOSIS — F05 Delirium due to known physiological condition: Secondary | ICD-10-CM

## 2013-05-31 DIAGNOSIS — G459 Transient cerebral ischemic attack, unspecified: Secondary | ICD-10-CM

## 2013-06-03 ENCOUNTER — Other Ambulatory Visit: Payer: Self-pay | Admitting: Interventional Cardiology

## 2013-06-03 ENCOUNTER — Telehealth: Payer: Self-pay | Admitting: Nurse Practitioner

## 2013-06-03 ENCOUNTER — Encounter (HOSPITAL_COMMUNITY): Payer: Self-pay | Admitting: Pharmacy Technician

## 2013-06-03 NOTE — Telephone Encounter (Signed)
Called left message of normal carotid doppler results. LL

## 2013-06-07 ENCOUNTER — Encounter (HOSPITAL_COMMUNITY)
Admission: RE | Disposition: A | Discharge status: Home / Self Care | Payer: Self-pay | Source: Ambulatory Visit | Attending: Interventional Cardiology

## 2013-06-07 ENCOUNTER — Ambulatory Visit (HOSPITAL_COMMUNITY)
Admission: RE | Admit: 2013-06-07 | Discharge: 2013-06-08 | Disposition: A | Discharge status: Home / Self Care | Payer: Managed Care, Other (non HMO) | Source: Ambulatory Visit | Attending: Interventional Cardiology | Admitting: Interventional Cardiology

## 2013-06-07 DIAGNOSIS — I200 Unstable angina: Secondary | ICD-10-CM

## 2013-06-07 DIAGNOSIS — E876 Hypokalemia: Secondary | ICD-10-CM | POA: Insufficient documentation

## 2013-06-07 DIAGNOSIS — IMO0002 Reserved for concepts with insufficient information to code with codable children: Secondary | ICD-10-CM

## 2013-06-07 DIAGNOSIS — Z955 Presence of coronary angioplasty implant and graft: Secondary | ICD-10-CM

## 2013-06-07 DIAGNOSIS — I10 Essential (primary) hypertension: Secondary | ICD-10-CM | POA: Insufficient documentation

## 2013-06-07 DIAGNOSIS — Z6841 Body Mass Index (BMI) 40.0 and over, adult: Secondary | ICD-10-CM | POA: Insufficient documentation

## 2013-06-07 DIAGNOSIS — I209 Angina pectoris, unspecified: Secondary | ICD-10-CM | POA: Insufficient documentation

## 2013-06-07 DIAGNOSIS — E119 Type 2 diabetes mellitus without complications: Secondary | ICD-10-CM | POA: Insufficient documentation

## 2013-06-07 DIAGNOSIS — E1165 Type 2 diabetes mellitus with hyperglycemia: Secondary | ICD-10-CM

## 2013-06-07 DIAGNOSIS — I2510 Atherosclerotic heart disease of native coronary artery without angina pectoris: Secondary | ICD-10-CM

## 2013-06-07 DIAGNOSIS — E669 Obesity, unspecified: Secondary | ICD-10-CM | POA: Insufficient documentation

## 2013-06-07 HISTORY — DX: Type 2 diabetes mellitus without complications: E11.9

## 2013-06-07 HISTORY — DX: Atherosclerotic heart disease of native coronary artery without angina pectoris: I25.10

## 2013-06-07 HISTORY — DX: Pure hypercholesterolemia, unspecified: E78.00

## 2013-06-07 HISTORY — DX: Gout, unspecified: M10.9

## 2013-06-07 HISTORY — DX: Angina pectoris, unspecified: I20.9

## 2013-06-07 HISTORY — DX: Shortness of breath: R06.02

## 2013-06-07 HISTORY — DX: Unspecified osteoarthritis, unspecified site: M19.90

## 2013-06-07 HISTORY — PX: INTRAVASCULAR ULTRASOUND: SHX5452

## 2013-06-07 HISTORY — PX: PERCUTANEOUS CORONARY STENT INTERVENTION (PCI-S): SHX5485

## 2013-06-07 HISTORY — PX: CORONARY ANGIOPLASTY WITH STENT PLACEMENT: SHX49

## 2013-06-07 LAB — GLUCOSE, CAPILLARY
Glucose-Capillary: 267 mg/dL — ABNORMAL HIGH (ref 70–99)
Glucose-Capillary: 188 mg/dL — ABNORMAL HIGH (ref 70–99)

## 2013-06-07 SURGERY — INTRAVASCULAR ULTRASOUND

## 2013-06-07 MED ORDER — SODIUM CHLORIDE 0.9 % IV SOLN
INTRAVENOUS | Status: DC
  Administered 2013-06-07: 12:00:00 via INTRAVENOUS

## 2013-06-07 MED ORDER — INSULIN GLARGINE 100 UNIT/ML ~~LOC~~ SOLN
18.0000 [IU] | Freq: Every day | SUBCUTANEOUS | Status: DC
  Administered 2013-06-07: 18 [IU] via SUBCUTANEOUS
  Filled 2013-06-07 (×2): qty 0.18

## 2013-06-07 MED ORDER — MIDAZOLAM HCL 2 MG/2ML IJ SOLN
INTRAMUSCULAR | Status: AC
  Filled 2013-06-07: qty 2

## 2013-06-07 MED ORDER — SODIUM CHLORIDE 0.9 % IJ SOLN: 3.0000 mL | Freq: Two times a day (BID) | INTRAMUSCULAR | Status: DC

## 2013-06-07 MED ORDER — ASPIRIN EC 81 MG PO TBEC: 81.0000 mg | DELAYED_RELEASE_TABLET | Freq: Every day | ORAL | Status: DC

## 2013-06-07 MED ORDER — ALUM & MAG HYDROXIDE-SIMETH 200-200-20 MG/5ML PO SUSP
30.0000 mL | ORAL | Status: DC | PRN
  Administered 2013-06-07: 30 mL via ORAL
  Filled 2013-06-07: qty 30

## 2013-06-07 MED ORDER — SODIUM CHLORIDE 0.9 % IV SOLN: 1.0000 mL/kg/h | INTRAVENOUS | Status: AC

## 2013-06-07 MED ORDER — NITROGLYCERIN 0.2 MG/ML ON CALL CATH LAB
INTRAVENOUS | Status: AC
  Filled 2013-06-07: qty 1

## 2013-06-07 MED ORDER — SODIUM CHLORIDE 0.9 % IJ SOLN: 3.0000 mL | INTRAMUSCULAR | Status: DC | PRN

## 2013-06-07 MED ORDER — SODIUM CHLORIDE 0.9 % IV SOLN: 250.0000 mL | INTRAVENOUS | Status: DC | PRN

## 2013-06-07 MED ORDER — ATORVASTATIN CALCIUM 40 MG PO TABS
40.0000 mg | ORAL_TABLET | Freq: Every day | ORAL | Status: DC
  Administered 2013-06-08: 40 mg via ORAL
  Filled 2013-06-07: qty 1

## 2013-06-07 MED ORDER — LIDOCAINE HCL (PF) 1 % IJ SOLN
INTRAMUSCULAR | Status: AC
  Filled 2013-06-07: qty 30

## 2013-06-07 MED ORDER — CLOPIDOGREL BISULFATE 75 MG PO TABS
75.0000 mg | ORAL_TABLET | Freq: Every day | ORAL | Status: DC
  Administered 2013-06-08: 10:00:00 75 mg via ORAL
  Filled 2013-06-07: qty 1

## 2013-06-07 MED ORDER — MORPHINE SULFATE 2 MG/ML IJ SOLN: 1.0000 mg | INTRAMUSCULAR | Status: DC | PRN

## 2013-06-07 MED ORDER — CLOPIDOGREL BISULFATE 300 MG PO TABS
ORAL_TABLET | ORAL | Status: AC
  Filled 2013-06-07: qty 1

## 2013-06-07 MED ORDER — ONDANSETRON HCL 4 MG/2ML IJ SOLN: 4.0000 mg | Freq: Four times a day (QID) | INTRAMUSCULAR | Status: DC | PRN

## 2013-06-07 MED ORDER — BIVALIRUDIN 250 MG IV SOLR
INTRAVENOUS | Status: AC
  Filled 2013-06-07: qty 250

## 2013-06-07 MED ORDER — DIAZEPAM 5 MG PO TABS
5.0000 mg | ORAL_TABLET | ORAL | Status: AC
  Administered 2013-06-07: 5 mg via ORAL
  Filled 2013-06-07: qty 1

## 2013-06-07 MED ORDER — AMLODIPINE BESYLATE 5 MG PO TABS
5.0000 mg | ORAL_TABLET | Freq: Every day | ORAL | Status: DC
  Administered 2013-06-08: 10:00:00 5 mg via ORAL
  Filled 2013-06-07 (×2): qty 1

## 2013-06-07 MED ORDER — VERAPAMIL HCL 2.5 MG/ML IV SOLN
INTRAVENOUS | Status: AC
  Filled 2013-06-07: qty 2

## 2013-06-07 MED ORDER — FENTANYL CITRATE 0.05 MG/ML IJ SOLN
INTRAMUSCULAR | Status: AC
  Filled 2013-06-07: qty 2

## 2013-06-07 MED ORDER — LIRAGLUTIDE 18 MG/3ML ~~LOC~~ SOPN: 1.2000 mg | PEN_INJECTOR | Freq: Every morning | SUBCUTANEOUS | Status: DC

## 2013-06-07 MED ORDER — HEPARIN (PORCINE) IN NACL 2-0.9 UNIT/ML-% IJ SOLN
INTRAMUSCULAR | Status: AC
  Filled 2013-06-07: qty 1000

## 2013-06-07 MED ORDER — HYDROCHLOROTHIAZIDE 50 MG PO TABS
50.0000 mg | ORAL_TABLET | Freq: Every day | ORAL | Status: DC
  Administered 2013-06-08: 10:00:00 50 mg via ORAL
  Filled 2013-06-07: qty 1

## 2013-06-07 MED ORDER — HEPARIN (PORCINE) IN NACL 2-0.9 UNIT/ML-% IJ SOLN
INTRAMUSCULAR | Status: AC
  Filled 2013-06-07: qty 500

## 2013-06-07 MED ORDER — ALUM & MAG HYDROXIDE-SIMETH 200-200-20 MG/5ML PO SUSP
ORAL | Status: AC
  Filled 2013-06-07: qty 30

## 2013-06-07 MED ORDER — ACETAMINOPHEN 325 MG PO TABS: 650.0000 mg | ORAL_TABLET | ORAL | Status: DC | PRN

## 2013-06-07 MED ORDER — ASPIRIN EC 325 MG PO TBEC
325.0000 mg | DELAYED_RELEASE_TABLET | Freq: Every day | ORAL | Status: DC
  Administered 2013-06-08: 325 mg via ORAL
  Filled 2013-06-07: qty 1

## 2013-06-07 MED ORDER — CLOPIDOGREL BISULFATE 75 MG PO TABS: 75.0000 mg | ORAL_TABLET | Freq: Every day | ORAL | Status: DC

## 2013-06-07 MED ORDER — ASPIRIN 81 MG PO CHEW
324.0000 mg | CHEWABLE_TABLET | ORAL | Status: AC
  Administered 2013-06-07: 324 mg via ORAL
  Filled 2013-06-07: qty 4

## 2013-06-07 MED ORDER — INSULIN ASPART 100 UNIT/ML ~~LOC~~ SOLN
10.0000 [IU] | Freq: Every morning | SUBCUTANEOUS | Status: DC
  Administered 2013-06-08: 10 [IU] via SUBCUTANEOUS

## 2013-06-07 MED ORDER — SODIUM CHLORIDE 0.9 % IV SOLN
1.7500 mg/kg/h | INTRAVENOUS | Status: DC
  Administered 2013-06-07: 1.75 mg/kg/h via INTRAVENOUS
  Filled 2013-06-07: qty 250

## 2013-06-07 MED ORDER — NITROGLYCERIN 0.4 MG SL SUBL: 0.4000 mg | SUBLINGUAL_TABLET | SUBLINGUAL | Status: DC | PRN

## 2013-06-07 MED ORDER — METOPROLOL SUCCINATE ER 25 MG PO TB24
25.0000 mg | ORAL_TABLET | Freq: Every day | ORAL | Status: DC
  Administered 2013-06-08: 10:00:00 25 mg via ORAL
  Filled 2013-06-07: qty 1

## 2013-06-07 NOTE — CV Procedure (Addendum)
PROCEDURE: Coronary angiogram, IVUS left main, PCI left main  INDICATIONS:  Abnormal stress test; CAD, angina  The risks, benefits, and details of the procedure were explained to the patient.  The patient verbalized understanding and wanted to proceed.  Informed written consent was obtained.  PROCEDURE TECHNIQUE:  After Xylocaine anesthesia a 70F sheath was placed in the right radial artery with a single anterior needle wall stick.   Right coronary angiography was done using a Judkins R4 guide catheter.  Left coronary angiography was done using a CLS 3.0  guide catheter.   CONTRAST:  Total of 130 cc.  COMPLICATIONS:  None.       ANGIOGRAPHIC DATA:   The left main coronary artery is a long vessel with an ostial 70% stenosis in the cranial views.  The left anterior descending artery is widely patent proximally but very small distally with a significant stenosis in the small distal vessel.  The left circumflex artery and its branches are widely patent.  The right coronary artery is a large dominant vessel which is widely patent.  PCI NARRATIVE: A CLS 3.0 Guide was used to engage the left main.  A prowater wire was placed across the area of disease in the left main and into the LAD.  IVUS was done showing a cross sectional area of 3.9 mm in the ostial left main. A 3.0 x 8 mm Emerge balloon was used to predilate the area.  THe patient tolerate the balloon inflation quite well.   A 4.0 x 12 Promus DES was deployed across the ostium of the Left main with a few mm hanging into the aorta.  This was postdilated with a 5.0 x 8 Dinosaur balloon to 14 Atm at the ostium.  There was an excellent angiographic result with a stepdown noted at the distal edge of the stent.  Repeat IVUS was done showing a well apposed stent and the ostium being fully covered with stent.    IMPRESSIONS:  1. Ostial 70% stenosis of the left main coronary artery.  IVUS reveals apparent significant left main disease with cross sectional  area of 3.9 mm2.  Successful PCI of the ostial to proximal left main with a 4.0 x 12 mm Promus DES, postdilated to 5.1 mm.  Repeat IVUS showed a well apposed stent.  TIMI 3 flow was maintained throughout.   RECOMMENDATION:  Continue dual antiplatelet therapy indefinitely.  Check P2Y12 study in AM.  RF modification including weight loss and diabetes control.

## 2013-06-07 NOTE — H&P (Addendum)
Admit date: 06/07/2013 Referring Physician Dr. Kym Groom Primary Cardiologist Dr. Kym Groom Chief complaint/reason for admission:angina  HPI:  General 42 year old man who had chest discomfort. He had a positive treadmill test with hypotension noted in 6/14 with inferior ST elevation. Urgent cardiac cath showed a moderate left main lesion was noted. There is a significant, distal LAD lesion noted but the vessel was quite small. Therefore, he was medically managed. He continues to have significant chest discomfort with minimal walking. He lives in a second-floor apartment and has chest discomfort by the time he reaches a second-floor. 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 His chest discomfort is relieved with rest. He also has pain in his chest after eating. He does feel that his lifestyle is limited. Current Medication:  Taking BD Pen 8mm 31G needles as directed 4 times aday     Taking Verio Gold Test Strips dx code 250.02 Test strips For use when checking blood sugars 2 times a day     Taking Victoza 18 MG/3ML Solution 1.2 MG Once a day     Taking Nitroglycerin 0.4 mg 0.4 mg tablet 1 tablet as directed as directed prn chest pain     Taking Hydrochlorothiazide 25 MG Tablet 1 tablet Once a day     Taking Metformin HCl 500 MG Tablet 2 tablets once a day     Taking NovoLog Flexpen 100 UNIT/ML Solution 13 units before breakfast     Taking Lantus SoloStar 100 UNIT/ML Solution 18 units Once a day     Taking Plavix 75 MG Tablet 1 tablet Once a day     Taking Aspir-81 81 MG Tablet Delayed Release 1 tablet Once a day     Taking Amlodipine Besylate 5 MG Tablet 1 tablet Once a day     Taking Metoprolol Succinate 25 MG Tablet Extended Release 24 Hour 1 tablet Once a day     Taking Atorvastatin Calcium 40 MG Tablet 1 tablet daily     Medication List reviewed and reconciled with the patient   Medical History:  diabetic      neuropathy     edema     shoulder tendonopathy - Murphy-Wainer     ASCAD s/p cath with 90% distal LAD stenosis and normal LVF   Allergies/Intolerance:  N.K.D.A.   Gyn History:   OB History:   Surgical History: Hospitalization: Family History: Social History: General Tobacco use  cigarettes: Never smoked no Smoking.  Alcohol: never.  Caffeine: 1 serving daily, coffee.  no Recreational drug use.  Diet: drinks water.  Exercise: walks when able.  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  Education: [employer].  Marital Status: Married.  Children: 1, Boys, 1, girls.  Seat belt use: yes.  ROS: FOLLOW-UP ROS denies nausea/vomiting"General: denies, fever, chills. no dysuria" GYN/GU no dysuria.Neurology: no focal deficits.  Chest discomfort with exertion. He had gastric bypass surgery in the past and lost over 200 pounds. He has no surgery planned. He states he had a possible mini stroke in the past. No intracranial bleeding. No ulcers. No GI bleeding in the past. As above, all other systems negative.  Objective: Vitals: Wt 272.6, Wt change 2.8 lb, Ht 67.75, BMI 41.75, Pulse sitting 80, BP sitting 100/70   Past Results: Examination:  General Examination GENERAL APPEARANCE alert, oriented, NAD, pleasant.  SKIN: normal, no rash.  HEAD: [place]/AT.  "EYES: EOMI, Conjunctiva clear, no drainage.  "NECK: supple, no bruits, no JVD.  LUNGS: clear to auscultation bilaterally, no wheezes, rhonchi, rales.  HEART: no murmurs, regular rate and rhythm, normal S1S2.  "ABDOMEN: soft and not tender, non-distended, no masses palpated.  BACK: unremarkable.  EXTREMITIES: no edema, 2+ right radial pulse, palpable pedal pulses bilaterally.  "NEUROLOGIC EXAM: non-focal exam.  PERIPHERAL PULSES: normal (2+) bilaterally PT pulses.  PSYCH appropriate mood and affect .  Physical Examination:   Assessment:  Assessment:  CAD (coronary artery disease), native coronary artery - 414.01 (Primary)      Angina Pectoris - 413.9     Obesity, unspecified - 278.00     Plan: Treatment: CAD (coronary artery disease), native coronary artery WJX:BJYNW Metabolic Lab:CBC with Diff Notes: Known coronary artery disease from recent cardiac catheterization. Left main stenosis was thought to be moderate but stress test findings would go along with this being more significant. His symptoms are not well-controlled. Angina Pectoris Notes: He wants to try to be more active to lose more weight but is unable to due to his symptoms. We spoke about intravascular ultrasound of the left main. We then discussed options for revascularization if the IVUS showed significant left main disease. The left main is a reasonable candidate for stenting since it is ostial and there is no disease at the distal bifurcation. I explained to him that we will likely have arterial access in his right groin in the event the balloon pump is needed. we discussed bypass surgery as well. He is scared of the possibility of open heart surgery for multiple reasons. He understands the risks of cardiac cath with PCI of the left main,including need for emergency surgery, and is willing to proceed. If the IVUS shows significant left main disease, we will plan on PCI with a drug-eluting stent. He is willing to take dual antiplatelet therapy indefinitely. Obesity, unspecified Notes: He has done an excellent job losing weight. He is trying to maintain his weight loss and hopefully was low but more. We will plan on checking platelet function studies to be sure that Plavix is indeed therapeutic. Procedures: Venipuncture , performed in right arm, performed in left arm" label="Venipuncture:" notes="Smith,Michele 06/06/2013 03:55:04 PM &gt; , performed in right arm, performed in left arm" options="" propid="" itemid="202726" categoryid="202725" encounterid="[id number]"Venipuncture: Smith,Michele 06/06/2013 03:55:04 PM > , performed in right arm, performed in left  arm.  Immunizations: Therapeutic Injections: Diagnostic Imaging: Lab Reports: GNF:AOZHY Metabolic  GLUCOSE -  416 HH 70-99 - mg/dL  BUN -  12  8-65 - mg/dL  CREATININE -  7.84  6.96-2.95 - mg/dl  eGFR (NON-AFRICAN AMERICAN) -  108  >60 - calc  eGFR (AFRICAN AMERICAN) -  130  >60 - calc  SODIUM -  133 L 136-145 - mmol/L  POTASSIUM -  4.0  3.5-5.5 - mmol/L  CHLORIDE -  97 L 98-107 - mmol/L  C02 -  30  22-32 - mmol/L  ANION GAP -  9.9  6.0-20.0 - mmol/L  CALCIUM -  9.3  8.6-10.3 - mg/dL    Stegall,Amy 28/41/3244 03:53:10 PM > Called pt and he had pnly taken metformin today. Pt will take his novolog/lantus/victoza.  Lab:CBC with Diff  WBC -  8.8  4.0-11.0 - K/ul  RBC -  4.32  4.20-5.80 - M/uL  HGB -  13.0  13.0-17.0 - g/dL  HCT -  01.0 L 27.2-53.6 - %  MCH -  30.0  27.0-33.0 - pg  MPV -  9.2  7.5-10.7 - fL  MCV -  87.9  80.0-94.0 - fL  MCHC -  34.1  32.0-36.0 - g/dL  RDW -  16.1  09.6-04.5 - %  NRBC# -  0.00  -   PLT -  242  150-400 - K/uL  NEUT % -  63.2  43.3-71.9 - %  NRBC% -  0.00  - %  LYMPH% -  31.0  16.8-43.5 - %  MONO % -  3.7 L 4.6-12.4 - %  EOS % -  1.6  0.0-7.8 - %  BASO % -  0.5  0.0-1.0 - %  NEUT # -  5.5  1.9-7.2 - K/uL  LYMPH# -  2.70  1.10-2.70 - K/uL  MONO # -  0.3  0.3-0.8 - K/uL  EOS # -  0.1  0.0-0.6 - K/uL  BASO # -  0.0  0.0-0.1 - K/uL    Addendum: I spoke to the patient again today along with his wife.  I explained the risks and benefits of the procedure.  We went over the option of bypass surgery as well.  They're both in agreement that they want to avoid bypass surgery.  They understand the need for lifelong dual antiplatelet therapy.  They understand the risk for need for emergent surgery.  They understand that balloon pump placement may be needed as well.  All questions were answered.

## 2013-06-08 ENCOUNTER — Encounter (HOSPITAL_COMMUNITY): Payer: Self-pay | Admitting: General Practice

## 2013-06-08 LAB — BASIC METABOLIC PANEL
BUN: 9 mg/dL (ref 6–23)
CO2: 28 mEq/L (ref 19–32)
Chloride: 99 mEq/L (ref 96–112)
Creatinine, Ser: 0.62 mg/dL (ref 0.50–1.35)
Glucose, Bld: 254 mg/dL — ABNORMAL HIGH (ref 70–99)

## 2013-06-08 LAB — GLUCOSE, CAPILLARY: Glucose-Capillary: 210 mg/dL — ABNORMAL HIGH (ref 70–99)

## 2013-06-08 LAB — CBC
HCT: 38 % — ABNORMAL LOW (ref 39.0–52.0)
Hemoglobin: 13 g/dL (ref 13.0–17.0)
MCV: 87 fL (ref 78.0–100.0)
RBC: 4.37 MIL/uL (ref 4.22–5.81)
WBC: 5.7 10*3/uL (ref 4.0–10.5)

## 2013-06-08 MED ORDER — INSULIN ASPART 100 UNIT/ML ~~LOC~~ SOLN
10.0000 [IU] | Freq: Once | SUBCUTANEOUS | Status: AC
  Administered 2013-06-08: 01:00:00 via SUBCUTANEOUS

## 2013-06-08 MED ORDER — POTASSIUM CHLORIDE CRYS ER 20 MEQ PO TBCR
40.0000 meq | EXTENDED_RELEASE_TABLET | Freq: Once | ORAL | Status: AC
  Administered 2013-06-08: 10:00:00 40 meq via ORAL
  Filled 2013-06-08: qty 2

## 2013-06-08 MED ORDER — POTASSIUM CHLORIDE CRYS ER 20 MEQ PO TBCR: 20.0000 meq | EXTENDED_RELEASE_TABLET | Freq: Every day | ORAL | Status: AC

## 2013-06-08 MED ORDER — ASPIRIN 325 MG PO TBEC: 325.0000 mg | DELAYED_RELEASE_TABLET | Freq: Every day | ORAL | Status: DC

## 2013-06-08 MED ORDER — METFORMIN HCL ER 500 MG PO TB24: 1000.0000 mg | ORAL_TABLET | Freq: Every day | ORAL | Status: AC

## 2013-06-08 MED FILL — Sodium Chloride IV Soln 0.9%: INTRAVENOUS | Qty: 50 | Status: AC

## 2013-06-08 NOTE — Progress Notes (Signed)
CARDIAC REHAB PHASE I   PRE:  Rate/Rhythm: 85 SR    BP: sitting 124/75    SaO2:   MODE:  Ambulation: 500 ft   POST:  Rate/Rhythm: 103 ST    BP: sitting 124/72     SaO2:   Tolerated well, no CP. Sts he had indigestion last night, does not think it was angina. Reviewed ed. Pts insurance would not cover CRPII previously but hopefully it will now. Will send referral to [facility] again. Pt has good motivation. [phone]   Harriet Masson CES, ACSM 06/08/2013 9:49 AM

## 2013-06-08 NOTE — Progress Notes (Signed)
Pt's cbg at 2200 was 410 and pt received 18units of scheduled lantus insulin.  Pt refused at that time to have any novolog insulin stating his blood sugar is elevated to day b/c he did not take his metformin, victoza nor his q am dose of 10u novolog.  Cbg now 437 at 0030.  Pt now willing to take novolog.  Dr Tresa Endo notified and order received for novolog 10 units now sq.

## 2013-06-08 NOTE — Discharge Summary (Addendum)
Patient ID: Kendale Rembold MRN: [id number] DOB/AGE: 03/02/1971 42 y.o.  Admit date: 06/07/2013 Discharge date: 06/08/2013  Primary Discharge Diagnosis CAD Secondary Discharge Diagnosis DM, hypokalemia, HTN, obesity  Significant Diagnostic Studies: angiography: IVUS of left main; 4.0 x 12 Promus to left main postdilated to 5.1 mm in diameter.  Consults: None  Hospital Course: 42 y/o with CAD who had ben having CCS Class III angina with minimal activities despite multiple antianginal medicines.  He was brought back for IVIS of the left main.  This revealed a significant stenosis.  He tolerated the angioplasty noted above very well.  He walked with cardiac rehabilitation the following day and had noted chest discomfort.  The importance of daily Plavix with stress.  We checked P2 Y. 12 inhibition testing and this showed Plavix did provide adequate antiplatelet therapy.  Of note, during angioplasty, he did experience his typical chest discomfort with balloon inflation.   Discharge Exam: Blood pressure 107/70, pulse 75, temperature 97.8 F (36.6 C), temperature source Oral, resp. rate 20, height 5\' 9"  (1.753 m), weight 123.3 kg (271 lb 13.2 oz), SpO2 98.00%.   [place]/AT RRR S1S2 No wheezing 2+ right radial pulse, no hematoma No edema Labs:   Lab Results  Component Value Date   WBC 5.7 06/08/2013   HGB 13.0 06/08/2013   HCT 38.0* 06/08/2013   MCV 87.0 06/08/2013   PLT 236 06/08/2013    Recent Labs Lab 06/08/13 0552  NA 136  K 3.3*  CL 99  CO2 28  BUN 9  CREATININE 0.62  CALCIUM 9.0  GLUCOSE 254*   No results found for this basename: CKTOTAL, CKMB, CKMBINDEX, TROPONINI    No results found for this basename: CHOL   No results found for this basename: HDL   No results found for this basename: LDLCALC   No results found for this basename: TRIG   No results found for this basename: CHOLHDL   No results found for this basename: LDLDIRECT       EKG: NSR, mild anterior T wave  inversions  FOLLOW UP PLANS AND APPOINTMENTS    Medication List         amLODipine 5 MG tablet  Commonly known as:  NORVASC  Take 1 tablet by mouth daily.     aspirin EC 81 MG tablet  Take 81 mg by mouth daily.     atorvastatin 40 MG tablet  Commonly known as:  LIPITOR  Take 40 mg by mouth daily.     B-D ULTRAFINE III SHORT PEN 31G X 8 MM Misc  Generic drug:  Insulin Pen Needle     clopidogrel 75 MG tablet  Commonly known as:  PLAVIX  Take 75 mg by mouth daily.     hydrochlorothiazide 25 MG tablet  Commonly known as:  HYDRODIURIL  Take 50 mg by mouth daily.     insulin aspart 100 UNIT/ML injection  Commonly known as:  novoLOG  Inject 10 Units into the skin every morning.     insulin glargine 100 UNIT/ML injection  Commonly known as:  LANTUS  Inject 18 Units into the skin at bedtime.     metFORMIN 500 MG 24 hr tablet  Commonly known as:  GLUCOPHAGE-XR  Take 2 tablets (1,000 mg total) by mouth daily with breakfast.  Start taking on:  06/09/2013     metoprolol succinate 25 MG 24 hr tablet  Commonly known as:  TOPROL-XL  Take 25 mg by mouth daily.     nitroGLYCERIN 0.4  MG SL tablet  Commonly known as:  NITROSTAT  Place 0.4 mg under the tongue every 5 (five) minutes as needed for chest pain.     potassium chloride SA 20 MEQ tablet  Commonly known as:  K-DUR,KLOR-CON  Take 1 tablet (20 mEq total) by mouth daily.     VICTOZA 18 MG/3ML Sopn  Generic drug:  Liraglutide  Inject 1.2 mg into the skin every morning.           Follow-up Information   Follow up with Quintella Reichert, MD. (as scheduled)    Contact information:   [address] [place] [address] [phone]       BRING ALL MEDICATIONS WITH YOU TO FOLLOW UP APPOINTMENTS  Time spent with patient to include physician time:20 minutes Signed: VARANASI,JAYADEEP S. 06/08/2013, 8:45 AM

## 2013-06-09 NOTE — Procedures (Signed)
   [facility]  EEG (ELECTROENCEPHALOGRAM) REPORT   STUDY DATE: 05/25/13 PATIENT NAME: Cameron Jensen DOB: [birth year] MRN: [id number]  ORDERING CLINICIAN: Joycelyn Schmid, MD   TECHNOLOGIST: Kaylyn Lim TECHNIQUE: Electroencephalogram was recorded utilizing standard 10-20 system of lead placement and reformatted into average and bipolar montages.  RECORDING TIME: ACTIVATION: hyperventilation and photic stimulation  CLINICAL INFORMATION: 42 year old male with tremor and facial droop.  FINDINGS: Background rhythms of 9-10 hertz and 30-40 microvolts. No focal, lateralizing, epileptiform activity or seizures are seen. Patient recorded in the awake state.  IMPRESSION:  Normal EEG in the awake state.   INTERPRETING PHYSICIAN:  Suanne Marker, MD Certified in Neurology, Neurophysiology and Neuroimaging  [facility] [address], [address] [place], [place] [address] [phone]

## 2013-08-31 ENCOUNTER — Other Ambulatory Visit: Payer: Self-pay | Admitting: *Deleted

## 2013-08-31 MED ORDER — ATORVASTATIN CALCIUM 40 MG PO TABS: 40.0000 mg | ORAL_TABLET | Freq: Every day | ORAL | Status: AC

## 2013-09-11 ENCOUNTER — Other Ambulatory Visit: Payer: Self-pay | Admitting: *Deleted

## 2013-09-11 DIAGNOSIS — Z79899 Other long term (current) drug therapy: Secondary | ICD-10-CM

## 2013-09-11 DIAGNOSIS — I2510 Atherosclerotic heart disease of native coronary artery without angina pectoris: Secondary | ICD-10-CM

## 2013-09-15 ENCOUNTER — Other Ambulatory Visit: Payer: Self-pay

## 2013-09-28 ENCOUNTER — Ambulatory Visit: Payer: Managed Care, Other (non HMO) | Admitting: Cardiology

## 2013-10-07 ENCOUNTER — Encounter: Payer: Self-pay | Admitting: General Surgery

## 2013-10-12 ENCOUNTER — Encounter: Payer: Self-pay | Admitting: Cardiology

## 2013-10-12 ENCOUNTER — Ambulatory Visit (INDEPENDENT_AMBULATORY_CARE_PROVIDER_SITE_OTHER): Payer: Managed Care, Other (non HMO) | Admitting: Cardiology

## 2013-10-12 VITALS — BP 126/82 | HR 68 | Ht 69.0 in | Wt 270.0 lb

## 2013-10-12 DIAGNOSIS — Z6841 Body Mass Index (BMI) 40.0 and over, adult: Secondary | ICD-10-CM

## 2013-10-12 DIAGNOSIS — I2510 Atherosclerotic heart disease of native coronary artery without angina pectoris: Secondary | ICD-10-CM

## 2013-10-12 DIAGNOSIS — E7800 Pure hypercholesterolemia, unspecified: Secondary | ICD-10-CM | POA: Insufficient documentation

## 2013-10-12 NOTE — Progress Notes (Signed)
[address] [place], [place]  [address] Phone: [phone] Fax:  [phone]  Date:  10/12/2013   ID:  Cameron Jensen, DOB 1971-01-17, MRN [id number]  PCP:  Ethel Rana  Cardiologist:  Armanda Magic, MD     History of Present Illness: Cameron Jensen is a 42 y.o. male with a history of ASCAD and dyslipidemia.  He is doing well.  He denies any chest pain, SOB, DOE, LE edema, dizziness, palpitaitons or syncope.   Wt Readings from Last 3 Encounters:  10/12/13 270 lb (122.471 kg)  06/08/13 271 lb 13.2 oz (123.3 kg)  06/08/13 271 lb 13.2 oz (123.3 kg)     Past Medical History  Diagnosis Date  . Fibromyalgia   . Anginal pain   . Type II diabetes mellitus   . Exertional shortness of breath   . Arthritis     "fingers, knees" (06/08/2013)  . Gout   . High cholesterol   . Coronary artery disease     90% distal LAD s/p PCI of LM with residual distal LAD stenosis in small vessel    Current Outpatient Prescriptions  Medication Sig Dispense Refill  . amLODipine (NORVASC) 5 MG tablet Take 1 tablet by mouth daily.      Marland Kitchen aspirin EC 325 MG EC tablet Take 1 tablet (325 mg total) by mouth daily.  30 tablet  0  . atorvastatin (LIPITOR) 40 MG tablet Take 1 tablet (40 mg total) by mouth daily.  30 tablet  5  . B-D ULTRAFINE III SHORT PEN 31G X 8 MM MISC       . clopidogrel (PLAVIX) 75 MG tablet Take 75 mg by mouth daily.      . hydrochlorothiazide (HYDRODIURIL) 25 MG tablet Take 50 mg by mouth daily.      . insulin aspart (NOVOLOG) 100 UNIT/ML injection Inject 10 Units into the skin 2 (two) times daily.       . insulin glargine (LANTUS) 100 UNIT/ML injection Inject 18 Units into the skin at bedtime.       . Liraglutide (VICTOZA) 18 MG/3ML SOPN Inject 1.2 mg into the skin every morning.       . metFORMIN (GLUCOPHAGE-XR) 500 MG 24 hr tablet Take 2 tablets (1,000 mg total) by mouth daily with breakfast.      . metoprolol succinate (TOPROL-XL) 25 MG 24 hr tablet Take 25  mg by mouth daily.      . nitroGLYCERIN (NITROSTAT) 0.4 MG SL tablet Place 0.4 mg under the tongue every 5 (five) minutes as needed for chest pain.      . potassium chloride SA (K-DUR,KLOR-CON) 20 MEQ tablet Take 1 tablet (20 mEq total) by mouth daily.  30 tablet  11   No current facility-administered medications for this visit.    Allergies:   No Known Allergies  Social History:  The patient  reports that he has never smoked. He has never used smokeless tobacco. He reports that he drinks alcohol. He reports that he does not use illicit drugs.   Family History:  The patient's family history includes Diabetes in an other family member; Hypertension in his mother and another family member.   ROS:  Please see the history of present illness.   \   All other systems reviewed and negative.   PHYSICAL EXAM: VS:  BP 126/82  Pulse 68  Ht 5\' 9"  (1.753 m)  Wt 270 lb (122.471 kg)  BMI 39.85 kg/m2 Well nourished, well developed, in  no acute distress HEENT: normal Neck: no JVD Cardiac:  normal S1, S2; RRR; no murmur Lungs:  clear to auscultation bilaterally, no wheezing, rhonchi or rales Abd: soft, nontender, no hepatomegaly Ext: no edema Skin: warm and dry Neuro:  CNs 2-12 intact, no focal abnormalities noted       ASSESSMENT AND PLAN:  1. ASCAD with no angina  - continue ASA/Plavix/metoprolol 2. dyslipdiemia - LDL at goal at 70 07/2013  - continue atorvastatin 3.  Obesity - I have encouraged him to continue on his diet and try to walk 6 days a week with brisk walking  Followup with me in 6 months  Signed, Armanda Magic, MD 10/12/2013 4:31 PM

## 2013-10-12 NOTE — Patient Instructions (Signed)
Your physician recommends that you continue on your current medications as directed. Please refer to the Current Medication list given to you today.  Your physician wants you to follow-up in: 6 Month follow up with Dr. Sherlyn Lick will receive a reminder letter in the mail two months in advance. If you don't receive a letter, please call our office to schedule the follow-up appointment.

## 2013-10-13 ENCOUNTER — Other Ambulatory Visit: Payer: Managed Care, Other (non HMO)

## 2013-12-11 ENCOUNTER — Ambulatory Visit: Payer: Self-pay | Admitting: Internal Medicine

## 2014-08-24 IMAGING — CT CT MAXILLOFACIAL W/O CM
3 of 4 series · 17 of 47 positions shown, 20 images · non-contrast
Comparison: None.

CLINICAL DATA: Hit in face and left orbit with baseball.  Swelling,
headaches.

CT MAXILLOFACIAL WITHOUT CONTRAST
TECHNIQUE: Multidetector CT imaging of the maxillofacial
structures was performed. Multiplanar CT image reconstructions were
also generated.

[Series 3: max bone · axial · 0.36mm/px · z∈[-119,+36]mm · 11 of 74 slices shown, 14 images]
[im 6/74  brain]
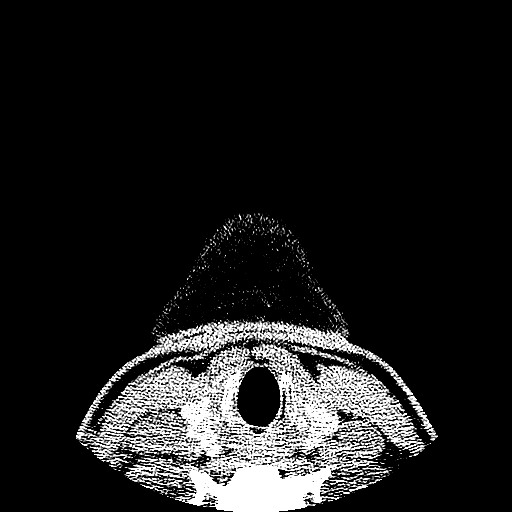
[im 6/74  bone]
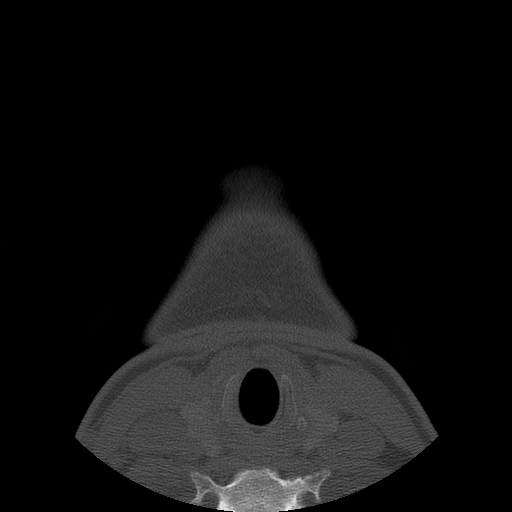
[im 11/74  bone]
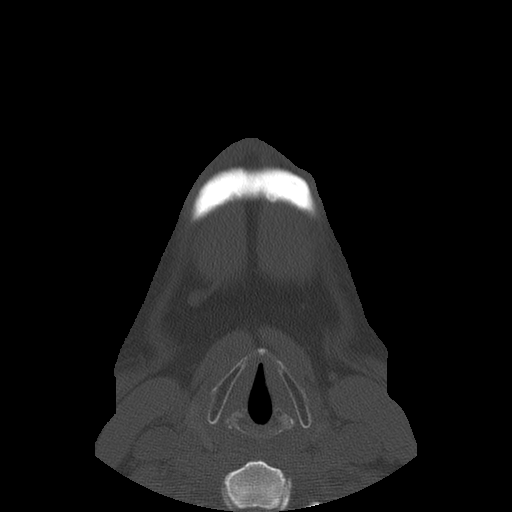
[im 16/74  bone]
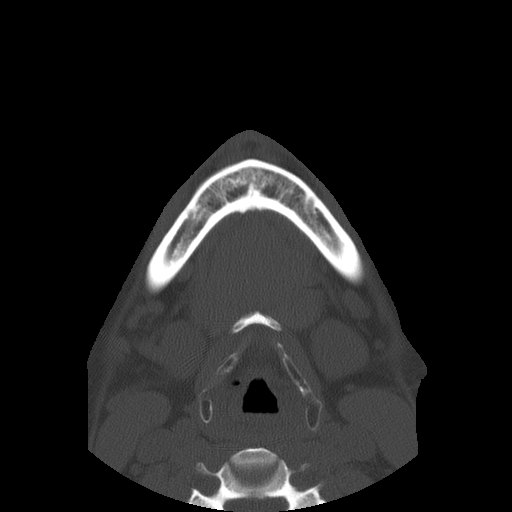
[im 27/74  bone]
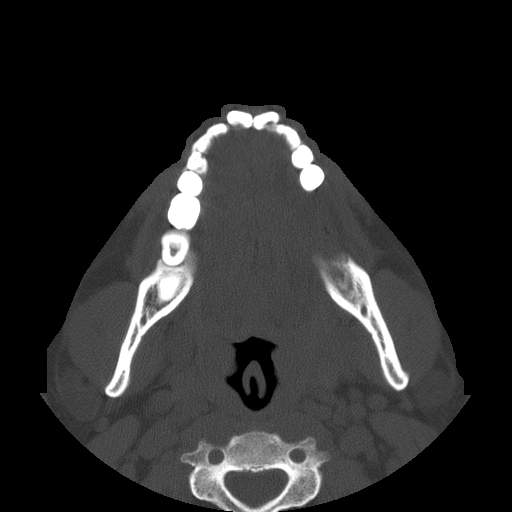
[im 32/74  brain]
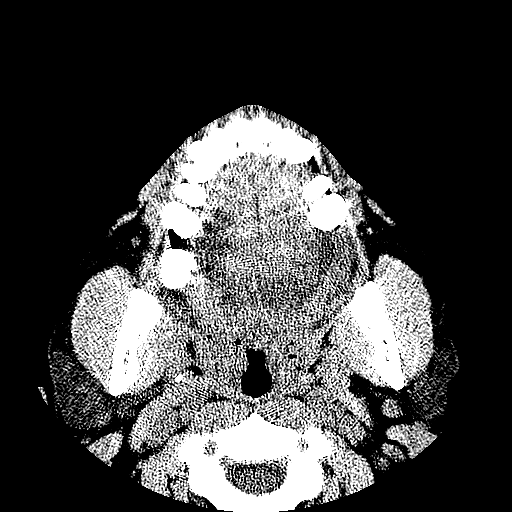
[im 32/74  bone]
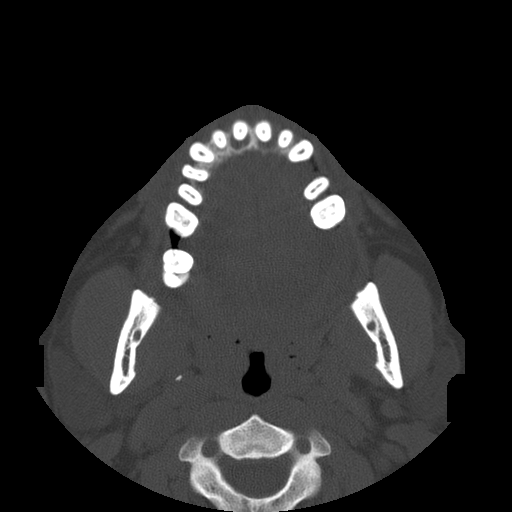
[im 37/74  bone]
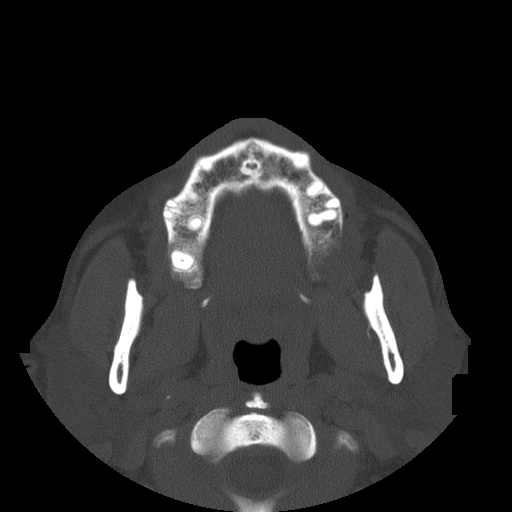
[im 42/74  bone]
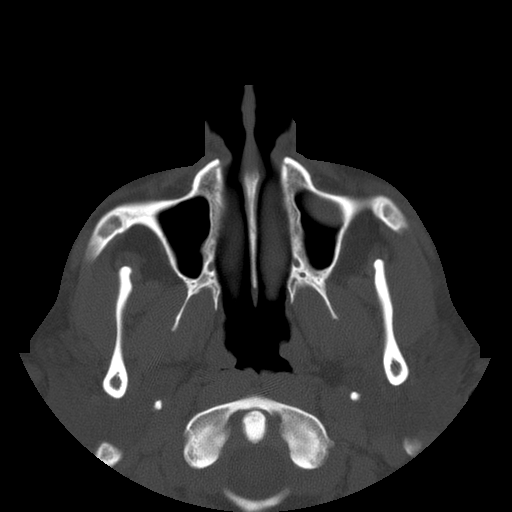
[im 47/74  bone]
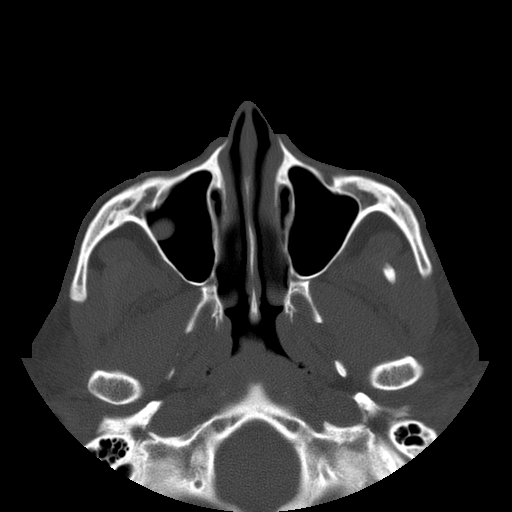
[im 58/74  brain]
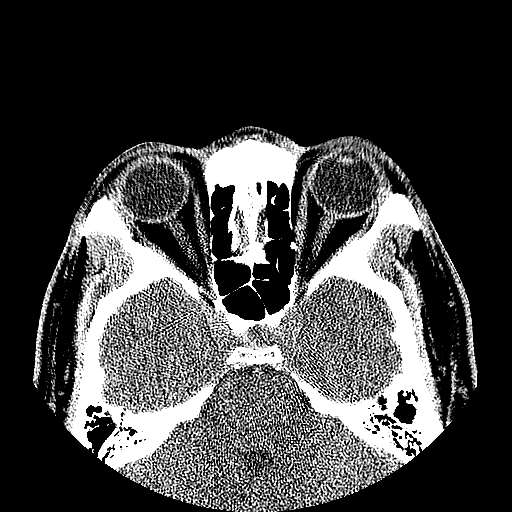
[im 58/74  bone]
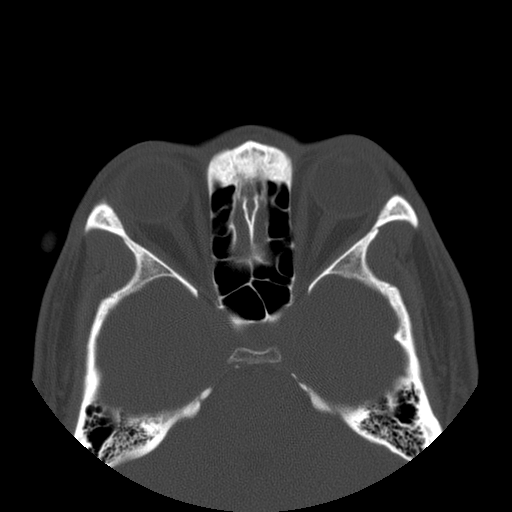
[im 63/74  bone]
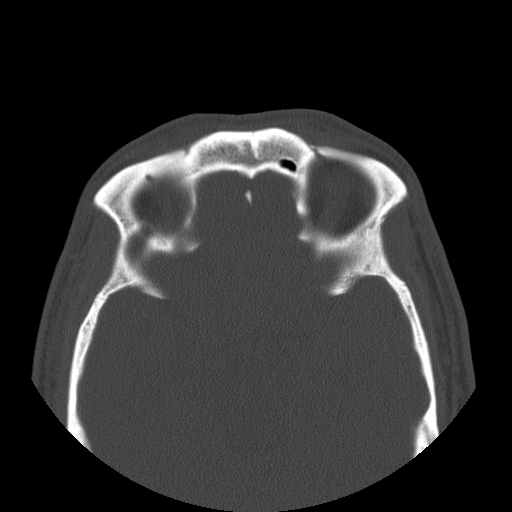
[im 68/74  bone]
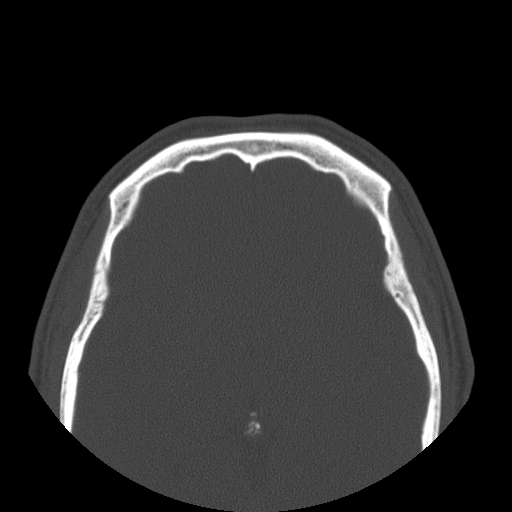

[Series 400: sagittal · sagittal · 0.37mm/px · 3 of 77 slices shown]
[im 26/77  bone]
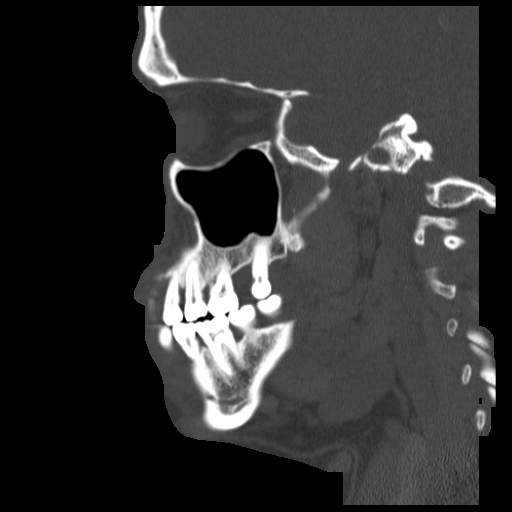
[im 39/77  bone]
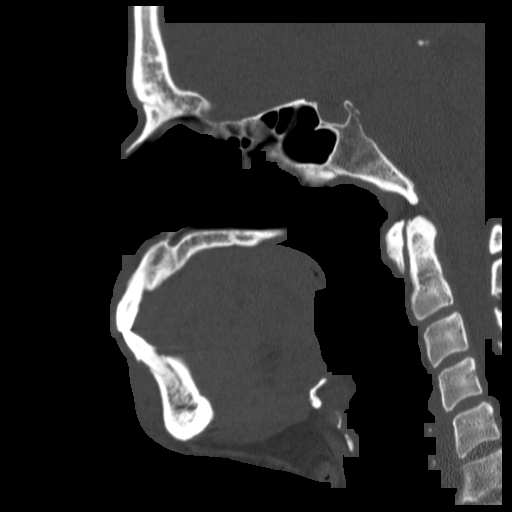
[im 51/77  bone]
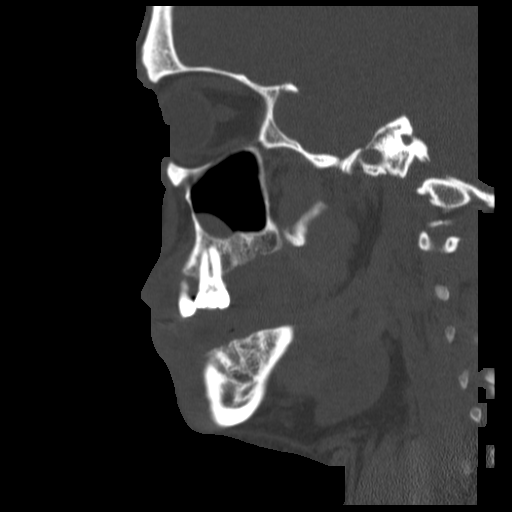

[Series 401: coronal · coronal · 0.37mm/px · 3 of 78 slices shown]
[im 26/78  bone]
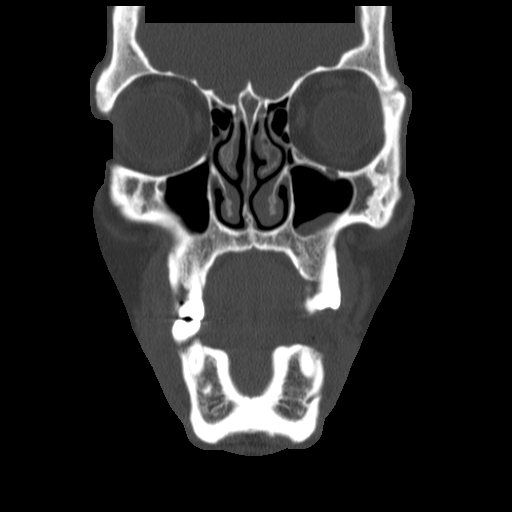
[im 35/78  bone]
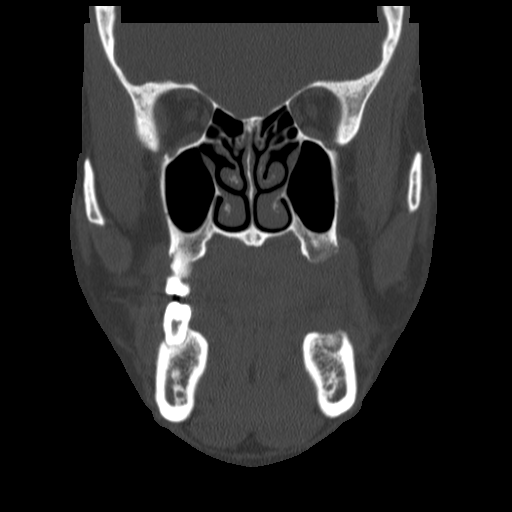
[im 43/78  bone]
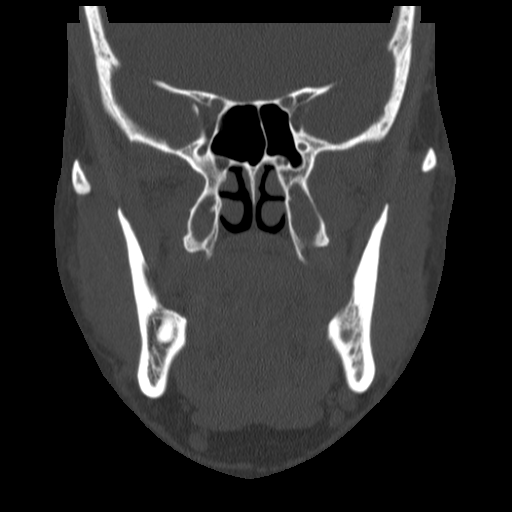

[17 of 47 positions shown; findings below may reference images not displayed]

FINDINGS: Rounded soft tissue in the maxillary sinuses bilaterally,
likely mucous retention cysts or polyps.  Paranasal sinuses
otherwise clear.  Frontal sinuses are hypoplastic.  Mastoids are
clear.

No evidence of orbital or facial fracture.  Zygomatic arches and
mandible intact.  Orbital walls intact.
IMPRESSION: No evidence of facial or orbital fracture.

## 2014-09-10 ENCOUNTER — Telehealth: Payer: Self-pay

## 2014-09-11 ENCOUNTER — Other Ambulatory Visit: Payer: Self-pay

## 2014-09-11 MED ORDER — ASPIRIN EC 81 MG PO TBEC: 81.0000 mg | DELAYED_RELEASE_TABLET | Freq: Every day | ORAL | Status: AC

## 2014-09-11 NOTE — Telephone Encounter (Signed)
81mg ASA

## 2014-10-19 ENCOUNTER — Encounter (HOSPITAL_COMMUNITY): Payer: Self-pay | Admitting: Interventional Cardiology

## 2015-06-29 ENCOUNTER — Emergency Department
Admission: EM | Admit: 2015-06-29 | Discharge: 2015-06-29 | Disposition: A | Discharge status: Home / Self Care | Payer: Managed Care, Other (non HMO) | Attending: Emergency Medicine | Admitting: Emergency Medicine

## 2015-06-29 ENCOUNTER — Encounter: Payer: Self-pay | Admitting: Emergency Medicine

## 2015-06-29 DIAGNOSIS — E119 Type 2 diabetes mellitus without complications: Secondary | ICD-10-CM | POA: Diagnosis not present

## 2015-06-29 DIAGNOSIS — Z79899 Other long term (current) drug therapy: Secondary | ICD-10-CM | POA: Insufficient documentation

## 2015-06-29 DIAGNOSIS — K068 Other specified disorders of gingiva and edentulous alveolar ridge: Secondary | ICD-10-CM | POA: Insufficient documentation

## 2015-06-29 DIAGNOSIS — K08409 Partial loss of teeth, unspecified cause, unspecified class: Secondary | ICD-10-CM | POA: Diagnosis not present

## 2015-06-29 DIAGNOSIS — Z7982 Long term (current) use of aspirin: Secondary | ICD-10-CM | POA: Diagnosis not present

## 2015-06-29 DIAGNOSIS — Z794 Long term (current) use of insulin: Secondary | ICD-10-CM | POA: Diagnosis not present

## 2015-06-29 LAB — CBC
HCT: 40.6 % (ref 40.0–52.0)
Hemoglobin: 13.4 g/dL (ref 13.0–18.0)
MCH: 29.4 pg (ref 26.0–34.0)
MCHC: 33 g/dL (ref 32.0–36.0)
MCV: 89.1 fL (ref 80.0–100.0)
Platelets: 258 10*3/uL (ref 150–440)
RBC: 4.56 MIL/uL (ref 4.40–5.90)
RDW: 13.8 % (ref 11.5–14.5)
WBC: 10.2 10*3/uL (ref 3.8–10.6)

## 2015-06-29 LAB — PROTIME-INR
INR: 0.99
Prothrombin Time: 13.3 seconds (ref 11.4–15.0)

## 2015-06-29 NOTE — ED Provider Notes (Signed)
CSN: [id number]     Arrival date & time 06/29/15  1539 History   First MD Initiated Contact with Patient 06/29/15 1856     Chief Complaint  Patient presents with  . Dental Problem     (Consider location/radiation/quality/duration/timing/severity/associated sxs/prior Treatment) HPI  44 year old male presents to the emergency department for oral bleeding. Patient had 3 teeth extracted yesterday at 9:30 PM. Last night he had persistent bleeding that lasted into this morning. He was able to get some control of the bleeding with calls, this seemed to fail later on in the day while he was at work. Patient is now having persistent bleeding from all 3 extraction sites. He is not having any pain. He was placed on antibiotic's. He denies any fevers chest pain shortness of breath abdominal pain nausea or vomiting. He shouldn't is on Plavix and aspirin. He is not bleeding from any other surfaces.  Past Medical History  Diagnosis Date  . Fibromyalgia   . Anginal pain   . Type II diabetes mellitus   . Exertional shortness of breath   . Arthritis     "fingers, knees" (06/08/2013)  . Gout   . High cholesterol   . Coronary artery disease     90% distal LAD s/p PCI of LM with residual distal LAD stenosis in small vessel   Past Surgical History  Procedure Laterality Date  . Knee arthroscopy Bilateral 1990's    "one on each side" (06/08/2013)  . Gastric bypass  2000  . Shoulder open rotator cuff repair Right 1990's  . Cardiac catheterization  04/2013  . Coronary angioplasty with stent placement  06/07/2013  . Left heart catheterization with coronary angiogram N/A 04/25/2013    Procedure: LEFT HEART CATHETERIZATION WITH CORONARY ANGIOGRAM;  Surgeon: Lesleigh Noe, MD;  Location: [facility] CATH LAB;  Service: Cardiovascular;  Laterality: N/A;  . Intravascular ultrasound  06/07/2013    Procedure: INTRAVASCULAR ULTRASOUND;  Surgeon: Corky Crafts, MD;  Location: [facility] CATH LAB;  Service: Cardiovascular;;  .  Percutaneous coronary stent intervention (pci-s)  06/07/2013    Procedure: PERCUTANEOUS CORONARY STENT INTERVENTION (PCI-S);  Surgeon: Corky Crafts, MD;  Location: [facility] CATH LAB;  Service: Cardiovascular;;   Family History  Problem Relation Age of Onset  . Diabetes    . Hypertension    . Hypertension Mother    Social History  Substance Use Topics  . Smoking status: Never Smoker   . Smokeless tobacco: Never Used  . Alcohol Use: 0.0 oz/week     Comment: occasionbally very 3-4 weeks    Review of Systems  Constitutional: Negative.  Negative for fever, chills, activity change and appetite change.  HENT: Positive for mouth sores. Negative for congestion, ear pain, rhinorrhea, sinus pressure, sore throat and trouble swallowing.   Eyes: Negative for photophobia, pain and discharge.  Respiratory: Negative for cough, chest tightness and shortness of breath.   Cardiovascular: Negative for chest pain and leg swelling.  Gastrointestinal: Negative for nausea, vomiting, abdominal pain, diarrhea and abdominal distention.  Genitourinary: Negative for dysuria and difficulty urinating.  Musculoskeletal: Negative for back pain, arthralgias and gait problem.  Skin: Negative for color change and rash.  Neurological: Negative for dizziness and headaches.  Hematological: Negative for adenopathy.  Psychiatric/Behavioral: Negative for behavioral problems and agitation.      Allergies  Review of patient's allergies indicates no known allergies.  Home Medications   Prior to Admission medications   Medication Sig Start Date End Date Taking? Authorizing Provider  amLODipine (NORVASC) 5 MG tablet Take 1 tablet by mouth daily. 05/19/13   Historical Provider, MD  aspirin EC 81 MG tablet Take 1 tablet (81 mg total) by mouth daily. 09/11/14   Quintella Reichert, MD  atorvastatin (LIPITOR) 40 MG tablet Take 1 tablet (40 mg total) by mouth daily. 08/31/13   Reather Littler, MD  B-D ULTRAFINE III SHORT PEN 31G X 8 MM  MISC  03/24/13   Historical Provider, MD  clopidogrel (PLAVIX) 75 MG tablet Take 75 mg by mouth daily.    Historical Provider, MD  hydrochlorothiazide (HYDRODIURIL) 25 MG tablet Take 50 mg by mouth daily.    Historical Provider, MD  insulin aspart (NOVOLOG) 100 UNIT/ML injection Inject 10 Units into the skin 2 (two) times daily.     Historical Provider, MD  insulin glargine (LANTUS) 100 UNIT/ML injection Inject 18 Units into the skin at bedtime.     Historical Provider, MD  Liraglutide (VICTOZA) 18 MG/3ML SOPN Inject 1.2 mg into the skin every morning.     Historical Provider, MD  metFORMIN (GLUCOPHAGE-XR) 500 MG 24 hr tablet Take 2 tablets (1,000 mg total) by mouth daily with breakfast. 06/09/13   Corky Crafts, MD  metoprolol succinate (TOPROL-XL) 25 MG 24 hr tablet Take 25 mg by mouth daily.    Historical Provider, MD  nitroGLYCERIN (NITROSTAT) 0.4 MG SL tablet Place 0.4 mg under the tongue every 5 (five) minutes as needed for chest pain.    Historical Provider, MD  potassium chloride SA (K-DUR,KLOR-CON) 20 MEQ tablet Take 1 tablet (20 mEq total) by mouth daily. 06/08/13   Corky Crafts, MD   BP 123/82 mmHg  Pulse 87  Temp(Src) 98.4 F (36.9 C) (Oral)  Resp 18  Ht 5\' 9"  (1.753 m)  Wt 360 lb (163.295 kg)  BMI 53.14 kg/m2  SpO2 98% Physical Exam  Constitutional: He is oriented to person, place, and time. He appears well-developed and well-nourished.  HENT:  Head: Normocephalic and atraumatic.  Patient with active bleeding at tooth extraction site for #1, #2, #32. Patient is forming active clots. There is no sign of abscess or infection or purulent drainage. Patient is minimally tender to palpation.  Eyes: Conjunctivae and EOM are normal. Pupils are equal, round, and reactive to light.  Neck: Normal range of motion. Neck supple.  Cardiovascular: Normal rate, regular rhythm, normal heart sounds and intact distal pulses.   Pulmonary/Chest: Effort normal and breath sounds normal. No  respiratory distress.  Abdominal: Soft. There is no tenderness.  Musculoskeletal: Normal range of motion. He exhibits no edema or tenderness.  Neurological: He is alert and oriented to person, place, and time.  Skin: Skin is warm and dry.  Psychiatric: He has a normal mood and affect. His behavior is normal. Judgment and thought content normal.    ED Course  Procedures (including critical care time) Labs Review Labs Reviewed  CBC  PROTIME-INR    Imaging Review No results found. I have personally reviewed and evaluated these images and lab results as part of my medical decision-making.   EKG Interpretation None      MDM   Final diagnoses:  Bleeding gums  S/P tooth extraction, unspecified edentulism    44 year old male presents today for evaluation of gum bleeding. He had 3 teeth extracted at 9:30 last night. He is on aspirin and Plavix. CBC, PT/INR normal. Extraction sites were packed with Surgicel, after one hour there was significant reduction in bleeding but he continued to have  bleeding at extraction site tooth #1 although it was 90% better. Patient was repacked at this location with Surgicel and sterile gauze was placed into the area to help apply pressure. He will remove the Surgicel in the morning and if continued bleeding, would recommend following up with dental clinic that procedure was performed. Patient states the clinic is open 7 days a week and he will follow-up tomorrow if needed. Patient will continue with antibiotic's.    Evon Slack, PA-C 06/29/15 2159  Governor Rooks, MD 07/12/15 1500

## 2015-06-29 NOTE — ED Notes (Signed)
x1 day , dental extraction x3 teeth , unable to stop the bleeding, pt called his dentist and was instructed to come here

## 2015-06-29 NOTE — Discharge Instructions (Signed)
Dental Dry Socket °A dental dry socket can happen after a tooth is pulled. When a tooth gets pulled, it leaves a hole (socket). Normally, blood fills up the hole and hardens (clots). A dry socket happens if blood gets removed from the hole or does not fill the hole.  °HOME CARE °· Follow your dentist's instructions. °· Only take medicines as told by your dentist. °· Take your medicine (antibiotics) as told if you are given medicine. Finish them even if you start to feel better. °· Wait 1 day to rinse your mouth with warm salt water. Spit water out very gently. °· Avoid bubbly (carbonated) drinks. °· Avoid alcohol. °· Avoid smoking. °GET HELP RIGHT AWAY IF: °· Your medicine does not help your pain. °· You have puffiness (swelling), severe pain, or you cannot stop bleeding. °· You have a temperature by mouth above 102° F (38.9° C), not controlled by medicine. °· You have trouble swallowing or cannot open your mouth. °· You have severe problems (symptoms). °MAKE SURE YOU: °· Understand these instructions. °· Will watch your condition. °· Will get help right away if you are not doing well or get worse. °Document Released: 10/27/2005 Document Revised: 01/19/2012 Document Reviewed: 02/17/2011 °[employer]® Patient Information ©2015 [employer]. This information is not intended to replace advice given to you by your health care provider. Make sure you discuss any questions you have with your health care provider. ° °Dental Extraction °A dental extraction procedure refers to a routine tooth extraction performed by your dentist. The procedure depends on where and how the tooth is positioned. The procedure can be very quick, sometimes lasting only seconds. Reasons for dental extraction include: °· Tooth decay. °· Infections (abscesses). °· The need to make room for other teeth. °· Gum diseases where the supporting bone has been destroyed. °· Fractures of the tooth leaving it unrestorable. °· Extra teeth (supernumerary) or  grossly malformed teeth. °· Baby teeth that have not fallen out in time and have not permitted the the permanent teeth to erupt properly. °· In preparation for braces where there is not enough room to align the teeth properly. °· Not enough room for wisdom teeth (particularly those that are impacted). °· Prior to receiving radiation to the head and neck, teeth in the field of radiation may need to be extracted. °LET YOUR CAREGIVER KNOW ABOUT: °· Any allergies. °· All medicines you are taking: °¨ Including herbs, eye drops, over-the-counter medications, and creams. °¨ Blood thinners (anticoagulants), aspirin, or other drugs that may affect blood clotting. °· Use of steroids (through mouth or as creams). °· Previous problems with anesthetics, including local anesthetics. °· History of bleeding or blood problems. °· Previous surgery. °· Possibility of pregnancy if this applies. °· Smoking history. °· Any health problems. °RISKS AND COMPLICATIONS °As with any procedure, complications may occur, but they can usually be managed by your caregiver. General surgical complications may include: °· Reaction to anesthesia. °· Damage to surrounding teeth, nerves, tissues, or structures. °· Infection. °· Bleeding. ° With appropriate treatment and care after surgery, the following complications are very uncommon: °· Dry socket (blood clot does not form or stay in place over empty socket). This can delay healing. °· Incomplete extraction of roots. °· Jawbone injury, pain, or weakness. °BEFORE THE PROCEDURE °·  Your dental care provider will: °¨ Take a medical and dental history. °¨ Take an X-ray to evaluate the circumstances and how to best extract the tooth. °¨ Do an oral exam. °· Depending on the situation, antibiotics   may be given before or after the extraction. °· Your caregivers may review the procedure, the local anesthesia and/or sedation being used, and what to expect after the procedure with you. °· If needed, your dentist  may give you a form of sedation, either by medicine you swallow, gas, or intravenously (IV). This will help to relieve anxiety. Complicated extractions may require the use of general anesthesia. ° It is important to follow your caregiver's instructions prior to your procedure to avoid complications. Steps before your procedure may include: °· Alert your caregiver if you feel ill (sore throat, fever, upset stomach, etc.) in the days leading up to your procedure. °· Stop taking certain medications for several days prior to your procedure such as blood thinners. °· Take certain medications, such as antibiotics. °· Avoid eating and drinking for several hours before the procedure. This will help you to avoid complications from the sedation or anesthesia. °· Sign a patient consent form. °· Have a friend or family member drive you to the dentist and drive you home after the procedure. °· Wear comfortable, loose clothing. Limit makeup and jewelry. °· Quit smoking. If you are a smoker, this will raise the chances of a healing problem after your procedure. If you are thinking about quitting, talk to your surgeon about how long before the operation you should stop smoking. You may also get help from your primary caregiver. °PROCEDURE °Dental extraction is typically done as an outpatient procedure. IV sedation, local anesthesia, or both may be used. It will keep you comfortable and free of pain during the procedure.  °There are 2 types of extractions: °· Simple extraction involves a tooth that is visible in the mouth and above the gum line. After local anesthetic is given by injection, and the area is numbed, the dentist will loosen the tooth with a special instrument (elevator). Then another instrument (forceps) will be used to grasp the tooth and remove it from its socket. During the procedure you will feel some pressure, but you should not feel pain. If you do feel pain, tell your dentist. The open socket will be cleaned.  Dressings (gauze) will be placed in the socket to reduce bleeding. °· Surgical extractions are used if the tooth has not come into the mouth or the tooth is broken off below the gum line. The dentist will make a cut (incision) in the gum and may have to remove some of the bone around the tooth to aid in the removal of the tooth. After removal, stitches (sutures) may be required to close the area to help in healing and control bleeding. For some surgical extractions, you may need a general anesthetic or IV sedation (through the vein). °After both types of extractions, you may be given pain medication or other drugs to help healing. Other postoperative instructions will be given by your dental caregiver.  °AFTER THE PROCEDURE °· You will have gauze in your mouth where the tooth was removed. Gentle pressure on the gauze for up to 1 hour will help to control bleeding. °· A blood clot will begin to form over the open socket. This is normal. Do not touch the area or rinse it. °· Your pain will be controlled with medication and self-care. °· You will be given detailed instructions for care after surgery. °PROGNOSIS °While some discomfort is normal after tooth extraction, most patients recover fully in just a few days. °SEEK IMMEDIATE DENTAL CARE °· You have uncontrolled bleeding, marked swelling, or severe pain. °· You develop   a fever, difficulty swallowing, or other severe symptoms. °· You have questions or concerns. °Document Released: 10/27/2005 Document Revised: 03/13/2014 Document Reviewed: 01/31/2011 °[employer]® Patient Information ©2015 [employer]. This information is not intended to replace advice given to you by your health care provider. Make sure you discuss any questions you have with your health care provider. ° °
